# Patient Record
Sex: Female | Born: 1950 | Race: White | Hispanic: No | Marital: Married | Smoking: Never smoker
Health system: Southern US, Community
[De-identification: ages and names within clinical notes are randomized; demographics above are authoritative.]

## PROBLEM LIST (undated history)

## (undated) DIAGNOSIS — I10 Essential (primary) hypertension: Secondary | ICD-10-CM

## (undated) DIAGNOSIS — R112 Nausea with vomiting, unspecified: Secondary | ICD-10-CM

## (undated) DIAGNOSIS — Z9889 Other specified postprocedural states: Secondary | ICD-10-CM

## (undated) DIAGNOSIS — T8859XA Other complications of anesthesia, initial encounter: Secondary | ICD-10-CM

## (undated) DIAGNOSIS — E119 Type 2 diabetes mellitus without complications: Secondary | ICD-10-CM

## (undated) DIAGNOSIS — E079 Disorder of thyroid, unspecified: Secondary | ICD-10-CM

## (undated) DIAGNOSIS — N2889 Other specified disorders of kidney and ureter: Secondary | ICD-10-CM

## (undated) DIAGNOSIS — T4145XA Adverse effect of unspecified anesthetic, initial encounter: Secondary | ICD-10-CM

## (undated) DIAGNOSIS — I517 Cardiomegaly: Secondary | ICD-10-CM

## (undated) DIAGNOSIS — M810 Age-related osteoporosis without current pathological fracture: Secondary | ICD-10-CM

## (undated) HISTORY — PX: TOTAL ELBOW REPLACEMENT: SUR1214

## (undated) HISTORY — PX: CARDIAC DEFIBRILLATOR PLACEMENT: SHX171

## (undated) HISTORY — PX: BACK SURGERY: SHX140

---

## 2017-05-13 DIAGNOSIS — N2889 Other specified disorders of kidney and ureter: Secondary | ICD-10-CM

## 2017-05-13 HISTORY — DX: Other specified disorders of kidney and ureter: N28.89

## 2017-08-30 ENCOUNTER — Inpatient Hospital Stay (HOSPITAL_COMMUNITY)
Admission: EM | Admit: 2017-08-30 | Discharge: 2017-09-02 | DRG: 481 | Disposition: A | Discharge status: Skilled Nursing Facility | Payer: Medicare Other | Attending: Internal Medicine | Admitting: Internal Medicine

## 2017-08-30 ENCOUNTER — Emergency Department (HOSPITAL_COMMUNITY): Payer: Medicare Other

## 2017-08-30 ENCOUNTER — Other Ambulatory Visit: Payer: Self-pay

## 2017-08-30 ENCOUNTER — Encounter (HOSPITAL_COMMUNITY): Payer: Self-pay | Admitting: *Deleted

## 2017-08-30 DIAGNOSIS — Z6836 Body mass index (BMI) 36.0-36.9, adult: Secondary | ICD-10-CM | POA: Diagnosis not present

## 2017-08-30 DIAGNOSIS — S72141A Displaced intertrochanteric fracture of right femur, initial encounter for closed fracture: Principal | ICD-10-CM | POA: Diagnosis present

## 2017-08-30 DIAGNOSIS — I5022 Chronic systolic (congestive) heart failure: Secondary | ICD-10-CM | POA: Diagnosis present

## 2017-08-30 DIAGNOSIS — D62 Acute posthemorrhagic anemia: Secondary | ICD-10-CM | POA: Diagnosis not present

## 2017-08-30 DIAGNOSIS — I509 Heart failure, unspecified: Secondary | ICD-10-CM | POA: Diagnosis not present

## 2017-08-30 DIAGNOSIS — M810 Age-related osteoporosis without current pathological fracture: Secondary | ICD-10-CM | POA: Diagnosis present

## 2017-08-30 DIAGNOSIS — N183 Chronic kidney disease, stage 3 unspecified: Secondary | ICD-10-CM | POA: Diagnosis present

## 2017-08-30 DIAGNOSIS — E039 Hypothyroidism, unspecified: Secondary | ICD-10-CM | POA: Diagnosis present

## 2017-08-30 DIAGNOSIS — M25561 Pain in right knee: Secondary | ICD-10-CM | POA: Diagnosis present

## 2017-08-30 DIAGNOSIS — Z7982 Long term (current) use of aspirin: Secondary | ICD-10-CM

## 2017-08-30 DIAGNOSIS — Z79890 Hormone replacement therapy (postmenopausal): Secondary | ICD-10-CM

## 2017-08-30 DIAGNOSIS — Y92000 Kitchen of unspecified non-institutional (private) residence as  the place of occurrence of the external cause: Secondary | ICD-10-CM | POA: Diagnosis not present

## 2017-08-30 DIAGNOSIS — I130 Hypertensive heart and chronic kidney disease with heart failure and stage 1 through stage 4 chronic kidney disease, or unspecified chronic kidney disease: Secondary | ICD-10-CM | POA: Diagnosis present

## 2017-08-30 DIAGNOSIS — Z96629 Presence of unspecified artificial elbow joint: Secondary | ICD-10-CM | POA: Diagnosis present

## 2017-08-30 DIAGNOSIS — Z66 Do not resuscitate: Secondary | ICD-10-CM | POA: Diagnosis present

## 2017-08-30 DIAGNOSIS — E119 Type 2 diabetes mellitus without complications: Secondary | ICD-10-CM

## 2017-08-30 DIAGNOSIS — Z95810 Presence of automatic (implantable) cardiac defibrillator: Secondary | ICD-10-CM | POA: Diagnosis not present

## 2017-08-30 DIAGNOSIS — I10 Essential (primary) hypertension: Secondary | ICD-10-CM | POA: Diagnosis present

## 2017-08-30 DIAGNOSIS — S72001A Fracture of unspecified part of neck of right femur, initial encounter for closed fracture: Secondary | ICD-10-CM | POA: Diagnosis present

## 2017-08-30 DIAGNOSIS — Z79891 Long term (current) use of opiate analgesic: Secondary | ICD-10-CM | POA: Diagnosis not present

## 2017-08-30 DIAGNOSIS — M25551 Pain in right hip: Secondary | ICD-10-CM | POA: Diagnosis not present

## 2017-08-30 DIAGNOSIS — Z79899 Other long term (current) drug therapy: Secondary | ICD-10-CM

## 2017-08-30 DIAGNOSIS — E1122 Type 2 diabetes mellitus with diabetic chronic kidney disease: Secondary | ICD-10-CM | POA: Diagnosis present

## 2017-08-30 DIAGNOSIS — Z885 Allergy status to narcotic agent status: Secondary | ICD-10-CM

## 2017-08-30 DIAGNOSIS — I420 Dilated cardiomyopathy: Secondary | ICD-10-CM | POA: Diagnosis present

## 2017-08-30 DIAGNOSIS — Z8349 Family history of other endocrine, nutritional and metabolic diseases: Secondary | ICD-10-CM | POA: Diagnosis not present

## 2017-08-30 DIAGNOSIS — W010XXA Fall on same level from slipping, tripping and stumbling without subsequent striking against object, initial encounter: Secondary | ICD-10-CM | POA: Diagnosis present

## 2017-08-30 DIAGNOSIS — E669 Obesity, unspecified: Secondary | ICD-10-CM | POA: Diagnosis present

## 2017-08-30 DIAGNOSIS — Z419 Encounter for procedure for purposes other than remedying health state, unspecified: Secondary | ICD-10-CM

## 2017-08-30 DIAGNOSIS — Z794 Long term (current) use of insulin: Secondary | ICD-10-CM | POA: Diagnosis not present

## 2017-08-30 HISTORY — DX: Adverse effect of unspecified anesthetic, initial encounter: T41.45XA

## 2017-08-30 HISTORY — DX: Disorder of thyroid, unspecified: E07.9

## 2017-08-30 HISTORY — DX: Other specified postprocedural states: Z98.890

## 2017-08-30 HISTORY — DX: Essential (primary) hypertension: I10

## 2017-08-30 HISTORY — DX: Cardiomegaly: I51.7

## 2017-08-30 HISTORY — DX: Other specified postprocedural states: R11.2

## 2017-08-30 HISTORY — DX: Other complications of anesthesia, initial encounter: T88.59XA

## 2017-08-30 HISTORY — DX: Age-related osteoporosis without current pathological fracture: M81.0

## 2017-08-30 HISTORY — DX: Type 2 diabetes mellitus without complications: E11.9

## 2017-08-30 LAB — COMPREHENSIVE METABOLIC PANEL
ALT: 19 U/L (ref 14–54)
ANION GAP: 13 (ref 5–15)
AST: 25 U/L (ref 15–41)
Albumin: 4 g/dL (ref 3.5–5.0)
Alkaline Phosphatase: 70 U/L (ref 38–126)
BILIRUBIN TOTAL: 1 mg/dL (ref 0.3–1.2)
BUN: 29 mg/dL — ABNORMAL HIGH (ref 6–20)
CO2: 25 mmol/L (ref 22–32)
CREATININE: 1.52 mg/dL — AB (ref 0.44–1.00)
Calcium: 9.1 mg/dL (ref 8.9–10.3)
Chloride: 101 mmol/L (ref 101–111)
GFR calc non Af Amer: 34 mL/min — ABNORMAL LOW (ref >60)
GFR, EST AFRICAN AMERICAN: 40 mL/min — AB (ref >60)
GLUCOSE: 85 mg/dL (ref 65–99)
Potassium: 4.3 mmol/L (ref 3.5–5.1)
Sodium: 139 mmol/L (ref 135–145)
TOTAL PROTEIN: 7.5 g/dL (ref 6.5–8.1)

## 2017-08-30 LAB — CBC WITH DIFFERENTIAL/PLATELET
BASOS ABS: 0 10*3/uL (ref 0.0–0.1)
BASOS PCT: 0 %
EOS ABS: 0.2 10*3/uL (ref 0.0–0.7)
Eosinophils Relative: 3 %
HEMATOCRIT: 37.3 % (ref 36.0–46.0)
Hemoglobin: 11.8 g/dL — ABNORMAL LOW (ref 12.0–15.0)
Lymphocytes Relative: 23 %
Lymphs Abs: 1.6 10*3/uL (ref 0.7–4.0)
MCH: 29.9 pg (ref 26.0–34.0)
MCHC: 31.6 g/dL (ref 30.0–36.0)
MCV: 94.7 fL (ref 78.0–100.0)
MONO ABS: 0.8 10*3/uL (ref 0.1–1.0)
Monocytes Relative: 11 %
NEUTROS ABS: 4.4 10*3/uL (ref 1.7–7.7)
NEUTROS PCT: 63 %
Platelets: 158 10*3/uL (ref 150–400)
RBC: 3.94 MIL/uL (ref 3.87–5.11)
RDW: 13.8 % (ref 11.5–15.5)
WBC: 6.9 10*3/uL (ref 4.0–10.5)

## 2017-08-30 LAB — URINALYSIS, ROUTINE W REFLEX MICROSCOPIC
Bilirubin Urine: NEGATIVE
Glucose, UA: NEGATIVE mg/dL
Ketones, ur: NEGATIVE mg/dL
Nitrite: NEGATIVE
PROTEIN: NEGATIVE mg/dL
SPECIFIC GRAVITY, URINE: 1.013 (ref 1.005–1.030)
pH: 5 (ref 5.0–8.0)

## 2017-08-30 LAB — APTT: APTT: 30 s (ref 24–36)

## 2017-08-30 LAB — PROTIME-INR
INR: 1.02
PROTHROMBIN TIME: 13.3 s (ref 11.4–15.2)

## 2017-08-30 LAB — CBG MONITORING, ED: Glucose-Capillary: 92 mg/dL (ref 65–99)

## 2017-08-30 MED ORDER — ATORVASTATIN CALCIUM 80 MG PO TABS
80.0000 mg | ORAL_TABLET | Freq: Every day | ORAL | Status: DC
  Administered 2017-08-31 – 2017-09-02 (×3): 80 mg via ORAL
  Filled 2017-08-30 (×3): qty 1

## 2017-08-30 MED ORDER — BISACODYL 5 MG PO TBEC: 5.0000 mg | DELAYED_RELEASE_TABLET | Freq: Every day | ORAL | Status: DC | PRN

## 2017-08-30 MED ORDER — SODIUM CHLORIDE 0.9 % IV BOLUS
250.0000 mL | Freq: Once | INTRAVENOUS | Status: AC
  Administered 2017-08-30: 250 mL via INTRAVENOUS

## 2017-08-30 MED ORDER — HYDROMORPHONE HCL 1 MG/ML IJ SOLN
0.5000 mg | Freq: Once | INTRAMUSCULAR | Status: AC
  Administered 2017-08-30: 0.5 mg via INTRAVENOUS
  Filled 2017-08-30: qty 1

## 2017-08-30 MED ORDER — METHOCARBAMOL 500 MG PO TABS
500.0000 mg | ORAL_TABLET | Freq: Four times a day (QID) | ORAL | Status: DC | PRN
  Filled 2017-08-30: qty 1

## 2017-08-30 MED ORDER — INSULIN ASPART 100 UNIT/ML ~~LOC~~ SOLN
0.0000 [IU] | SUBCUTANEOUS | Status: DC
  Administered 2017-08-31: 5 [IU] via SUBCUTANEOUS
  Administered 2017-08-31: 3 [IU] via SUBCUTANEOUS
  Administered 2017-08-31: 7 [IU] via SUBCUTANEOUS
  Administered 2017-09-01: 5 [IU] via SUBCUTANEOUS
  Administered 2017-09-01: 7 [IU] via SUBCUTANEOUS
  Administered 2017-09-01 (×2): 5 [IU] via SUBCUTANEOUS
  Administered 2017-09-02: 2 [IU] via SUBCUTANEOUS
  Administered 2017-09-02: 3 [IU] via SUBCUTANEOUS
  Administered 2017-09-02: 2 [IU] via SUBCUTANEOUS

## 2017-08-30 MED ORDER — ONDANSETRON HCL 4 MG/2ML IJ SOLN: 4.0000 mg | Freq: Four times a day (QID) | INTRAMUSCULAR | Status: DC | PRN

## 2017-08-30 MED ORDER — ONDANSETRON HCL 4 MG/2ML IJ SOLN
4.0000 mg | Freq: Once | INTRAMUSCULAR | Status: AC
  Administered 2017-08-30: 4 mg via INTRAVENOUS
  Filled 2017-08-30: qty 2

## 2017-08-30 MED ORDER — GABAPENTIN 300 MG PO CAPS
300.0000 mg | ORAL_CAPSULE | Freq: Three times a day (TID) | ORAL | Status: DC
  Administered 2017-08-31 – 2017-09-02 (×8): 300 mg via ORAL
  Filled 2017-08-30 (×9): qty 1

## 2017-08-30 MED ORDER — HYDROMORPHONE HCL 1 MG/ML IJ SOLN
1.0000 mg | Freq: Once | INTRAMUSCULAR | Status: AC
  Administered 2017-08-30: 1 mg via INTRAVENOUS
  Filled 2017-08-30: qty 1

## 2017-08-30 MED ORDER — PROMETHAZINE HCL 25 MG/ML IJ SOLN
12.5000 mg | Freq: Once | INTRAMUSCULAR | Status: AC
  Administered 2017-08-30: 12.5 mg via INTRAVENOUS
  Filled 2017-08-30: qty 1

## 2017-08-30 MED ORDER — CARVEDILOL 25 MG PO TABS
25.0000 mg | ORAL_TABLET | Freq: Two times a day (BID) | ORAL | Status: DC
  Administered 2017-08-31 – 2017-09-02 (×6): 25 mg via ORAL
  Filled 2017-08-30 (×6): qty 1

## 2017-08-30 MED ORDER — METHOCARBAMOL 1000 MG/10ML IJ SOLN: 500.0000 mg | Freq: Four times a day (QID) | INTRAVENOUS | Status: DC | PRN

## 2017-08-30 MED ORDER — MORPHINE SULFATE (PF) 2 MG/ML IV SOLN
0.5000 mg | INTRAVENOUS | Status: DC | PRN
  Administered 2017-08-30: 0.5 mg via INTRAVENOUS
  Administered 2017-08-31: 1 mg via INTRAVENOUS
  Administered 2017-08-31: 0.5 mg via INTRAVENOUS
  Filled 2017-08-30 (×5): qty 1

## 2017-08-30 MED ORDER — MORPHINE SULFATE (PF) 2 MG/ML IV SOLN: 1.0000 mg | INTRAVENOUS | Status: DC | PRN

## 2017-08-30 MED ORDER — PANTOPRAZOLE SODIUM 40 MG PO TBEC
40.0000 mg | DELAYED_RELEASE_TABLET | Freq: Every day | ORAL | Status: DC
  Administered 2017-08-31: 40 mg via ORAL
  Filled 2017-08-30: qty 1

## 2017-08-30 MED ORDER — LEVOTHYROXINE SODIUM 88 MCG PO TABS
88.0000 ug | ORAL_TABLET | Freq: Every day | ORAL | Status: DC
  Administered 2017-08-31 – 2017-09-02 (×3): 88 ug via ORAL
  Filled 2017-08-30 (×4): qty 1

## 2017-08-30 MED ORDER — SENNOSIDES-DOCUSATE SODIUM 8.6-50 MG PO TABS: 1.0000 | ORAL_TABLET | Freq: Every evening | ORAL | Status: DC | PRN

## 2017-08-30 NOTE — ED Triage Notes (Signed)
Pt was visiting her daughter when she states that her feet "became tripped up" causing her to fall landing on her right hip. Pt denies hitting her head, c/o pain to right hip area.

## 2017-08-30 NOTE — ED Provider Notes (Signed)
[facility] EMERGENCY DEPARTMENT Provider Note   CSN: [id number] Arrival date & time: 08/30/17  4098     History   Chief Complaint Chief Complaint  Patient presents with  . Fall    HPI Tracy Sullivan is a 67 y.o. female.  Patient is a 67 year old female who presents to the emergency department with a complaint of a fall and right hip pain.  The patient was brought to the emergency department by EMS.  The patient states that she is visiting her daughter.  They were preparing to go out to dinner, she tripped in the kitchen, she fell, and landed primarily on her right hip.  The patient states that she has a history of osteoporosis.  The patient was unable to stand.  She has pain with any movement of the right lower extremity.  She denies hitting her head, or injuring her neck.  She denies any injury to her chest or abdomen.  Using any anticoagulation medications.  She presents now for assistance with this issue.  Patient denies    The history is provided by the patient.  Fall  Pertinent negatives include no chest pain, no abdominal pain and no shortness of breath.    History reviewed. No pertinent past medical history.  There are no active problems to display for this patient.   History reviewed. No pertinent surgical history.   OB History   None      Home Medications    Prior to Admission medications   Not on File    Family History No family history on file.  Social History Social History   Tobacco Use  . Smoking status: Not on file  Substance Use Topics  . Alcohol use: Not on file  . Drug use: Not on file     Allergies   Patient has no known allergies.   Review of Systems Review of Systems  Constitutional: Negative for activity change.       All ROS Neg except as noted in HPI  HENT: Negative for nosebleeds.   Eyes: Negative for photophobia and discharge.  Respiratory: Negative for cough, shortness of breath and wheezing.   Cardiovascular: Negative  for chest pain and palpitations.  Gastrointestinal: Negative for abdominal pain and blood in stool.  Genitourinary: Negative for dysuria, frequency and hematuria.  Musculoskeletal: Positive for arthralgias. Negative for back pain and neck pain.       Hip pain  Skin: Negative.   Neurological: Negative for dizziness, seizures and speech difficulty.  Psychiatric/Behavioral: Negative for confusion and hallucinations.     Physical Exam Updated Vital Signs There were no vitals taken for this visit.  Physical Exam  Constitutional: She is oriented to person, place, and time. She appears well-developed and well-nourished.  Non-toxic appearance.  HENT:  Head: Normocephalic.  Right Ear: Tympanic membrane and external ear normal.  Left Ear: Tympanic membrane and external ear normal.  No scalp abrasion or hematoma.  Eyes: Pupils are equal, round, and reactive to light. EOM and lids are normal.  Neck: Normal range of motion. Neck supple. Carotid bruit is not present.  Cardiovascular: Normal rate, regular rhythm, normal heart sounds, intact distal pulses and normal pulses.  Pulmonary/Chest: Breath sounds normal. No respiratory distress.  There is symmetrical rise and fall of the chest.  The patient speaks in complete sentences without problem.  There is no chest wall tenderness appreciated.  Abdominal: Soft. Bowel sounds are normal. There is no tenderness. There is no guarding.  Musculoskeletal: She exhibits  tenderness.  There is pain to palpation of the right hip.  There is pain to rocking of the pelvis.  There is pain at the quadricep area just above the right knee.  There is no palpable effusion appreciated at this time.  The patient has pain with any attempted movement of the right lower extremity.  There is no deformity or pain noted of the tibial area.  There is full range of motion of the toes and ankle on the right.  There is full range of motion of the left lower extremity.  There is total  range of motion of both right and left upper extremities.  Lymphadenopathy:       Head (right side): No submandibular adenopathy present.       Head (left side): No submandibular adenopathy present.    She has no cervical adenopathy.  Neurological: She is alert and oriented to person, place, and time. She has normal strength. No cranial nerve deficit or sensory deficit.  Skin: Skin is warm and dry.  Psychiatric: She has a normal mood and affect. Her speech is normal.  Nursing note and vitals reviewed.    ED Treatments / Results  Labs (all labs ordered are listed, but only abnormal results are displayed) Labs Reviewed - No data to display  EKG None  Radiology No results found.  Procedures Procedures (including critical care time)  Medications Ordered in ED Medications - No data to display   Initial Impression / Assessment and Plan / ED Course  I have reviewed the triage vital signs and the nursing notes.  Pertinent labs & imaging results that were available during my care of the patient were reviewed by me and considered in my medical decision making (see chart for details).      Final Clinical Impressions(s) / ED Diagnoses  MDM Patient sustained a fall and injured the right hip.  She also has pain of the right knee.   X-ray of the right knee is negative for fracture or dislocation.  X-ray of the right hip shows a comminuted intratrochanteric fracture of the right hip.    Case discussed with Dr. Tawanna SatBlackman-orthopedics.  He will see the patient at the [facility] in [place].  We will discussed the case with the hospitalist for hospital admission.  Urine analysis shows a trace leukocyte esterase with 6-30 WBCs.  We will send the urine down for culture.  The comprehensive metabolic panel shows the BUN to be elevated at 29, the creatinine elevated at 1.5 the glomerular filtration rate is low at 34.   Complete blood count is well within normal limits.  Chest x-ray is  negative for acute event.  The electrocardiogram shows an atrial sensed and ventricular paced rhythm with ventricular pacing.  There is a rate of 68 bpm.  Case discussed with Hospitalist. Pt to [facility]. Discussed plans with family. Questions answered.   Final diagnoses:  Closed right hip fracture, initial encounter [facility](HCC)    ED Discharge Orders    None       Ivery QualeBryant, Shadman Tozzi, PA-C 08/30/17 2300    Samuel JesterMcManus, Kathleen, DO 08/31/17 1746

## 2017-08-30 NOTE — H&P (Signed)
History and Physical    Tracy Sullivan ZOX:[id number] DOB: 09-08-1950 DOA: 08/30/2017  PCP: Irene Shipper, MD   Patient coming from: Home  Chief Complaint: Fall with right hip pain   HPI: Tracy Sullivan is a 67 y.o. female with medical history significant for CHF, insulin-dependent diabetes mellitus, hypertension, osteoarthritis, and hypothyroidism, now presenting to the emergency department for evaluation of severe right hip pain after a fall at home.  Patient had been in her usual state, was having an uneventful day, and tripped, falling onto her right side without hitting her head or losing consciousness.  She had immediate and severe pain at the right hip, prompting her to come into the ED for evaluation.  She reports that at her baseline, she does not experience any chest pain, but does become short of breath with walking several steps.  No recent fevers, chills, cough, vomiting, or diarrhea.  ED Course: Upon arrival to the ED, patient is found to be afebrile, saturating well on room air, and with vitals otherwise stable.  EKG features a paced rhythm and chest x-ray is negative for acute cardiopulmonary disease.  Radiographs of the right hip reveal intertrochanteric femoral fracture and radiographs of the knee are negative.  Chemistry panel is notable for creatinine 1.52, similar to her prior value.  CBC is unremarkable, urinalysis unremarkable, and INR is normal.  Orthopedic surgery was consulted by the ED physician and recommended transfer to [facility] where they will see the patient in consultation.  Review of Systems:  All other systems reviewed and apart from HPI, are negative.  Past Medical History:  Diagnosis Date  . Diabetes mellitus without complication (HCC)   . Enlarged heart   . Hypertension   . Osteoporosis   . Thyroid disease     Past Surgical History:  Procedure Laterality Date  . BACK SURGERY    . CARDIAC DEFIBRILLATOR PLACEMENT     defibrillator and  pacemaker per pt  . CESAREAN SECTION    . TOTAL ELBOW REPLACEMENT       reports that she has never smoked. She has never used smokeless tobacco. She reports that she does not drink alcohol or use drugs.  Allergies  Allergen Reactions  . Codeine Nausea Only  . Tramadol Nausea Only    Family History  Problem Relation Age of Onset  . Obesity Daughter      Prior to Admission medications   Medication Sig Start Date End Date Taking? Authorizing Provider  acetaminophen (TYLENOL) 650 MG CR tablet Take 650 mg by mouth every 8 (eight) hours as needed for pain.   Yes [provider]  amLODipine (NORVASC) 5 MG tablet Take 5 mg by mouth daily. 08/06/16  Yes [provider]  aspirin 81 MG tablet Take 81 tablets by mouth daily.   Yes [provider]  atorvastatin (LIPITOR) 40 MG tablet Take 40 tablets by mouth daily. 08/06/16  Yes [provider]  carvedilol (COREG) 25 MG tablet  07/08/17  Yes [provider]  furosemide (LASIX) 40 MG tablet Take 40 mg by mouth daily. 07/21/16  Yes [provider]  gabapentin (NEURONTIN) 300 MG capsule Take 300 mg by mouth 3 (three) times daily.  07/22/16  Yes [provider]  HYDROcodone-acetaminophen (NORCO) 10-325 MG tablet Take by mouth. 10/08/12  Yes [provider]  insulin NPH-regular Human (HUMULIN 70/30) (70-30) 100 UNIT/ML injection Inject 20 Units into the skin daily.   Yes [provider]  levothyroxine (SYNTHROID, LEVOTHROID)  88 MCG tablet Take 88 mcg by mouth daily.  07/08/17  Yes [provider]  lisinopril (PRINIVIL,ZESTRIL) 20 MG tablet Take 20 mg by mouth 2 (two) times daily. 05/27/16  Yes [provider]  loratadine (CLARITIN) 10 MG tablet Take 10 tablets by mouth daily.   Yes [provider]  omeprazole (PRILOSEC) 20 MG capsule Take 20 mg by mouth daily. 08/06/16  Yes [provider]  tiZANidine (ZANAFLEX) 4 MG tablet Take 4 mg by mouth  as needed. 08/06/16  Yes [provider]  Semaglutide (OZEMPIC) 0.25 or 0.5 MG/DOSE SOPN Inject 0.5 Units into the skin once a week. Takes on tuesdays    [provider]    Physical Exam: Vitals:   08/30/17 2130 08/30/17 2200 08/30/17 2230 08/30/17 2236  BP: 121/63 97/60 (!) 75/60 (!) 76/43  Pulse: 67 69 74 76  Resp: 15 11 11 17   Temp:      TempSrc:      SpO2: 96% 98% 98% 98%  Weight:      Height:          Constitutional: NAD, calm, obese, appears older than stated age  Eyes: PERTLA, lids and conjunctivae normal ENMT: Mucous membranes are moist. Posterior pharynx clear of any exudate or lesions.   Neck: normal, supple, no masses, no thyromegaly Respiratory: clear to auscultation bilaterally, no wheezing, no crackles. Normal respiratory effort.   Cardiovascular: S1 & S2 heard, regular rate and rhythm. No extremity edema.   Abdomen: No distension, no tenderness,soft. Bowel sounds normal.  Musculoskeletal: no clubbing / cyanosis. Right hip exquisitely tender; NVI distally.   Skin: no significant rashes, lesions, ulcers. Warm, dry, well-perfused. Neurologic: CN 2-12 grossly intact. Sensation intact. Strength 5/5 in all 4 limbs.  Psychiatric: Alert and oriented x 3. Calm, cooperative.     Labs on Admission: I have personally reviewed following labs and imaging studies  CBC: Recent Labs  Lab 08/30/17 2117  WBC 6.9  NEUTROABS 4.4  HGB 11.8*  HCT 37.3  MCV 94.7  PLT 158   Basic Metabolic Panel: Recent Labs  Lab 08/30/17 2117  NA 139  K 4.3  CL 101  CO2 25  GLUCOSE 85  BUN 29*  CREATININE 1.52*  CALCIUM 9.1   GFR: Estimated Creatinine Clearance: 39.1 mL/min (A) (by C-G formula based on SCr of 1.52 mg/dL (H)). Liver Function Tests: Recent Labs  Lab 08/30/17 2117  AST 25  ALT 19  ALKPHOS 70  BILITOT 1.0  PROT 7.5  ALBUMIN 4.0   No results for input(s): LIPASE, AMYLASE in the last 168 hours. No results for input(s): AMMONIA in the last 168  hours. Coagulation Profile: Recent Labs  Lab 08/30/17 2117  INR 1.02   Cardiac Enzymes: No results for input(s): CKTOTAL, CKMB, CKMBINDEX, TROPONINI in the last 168 hours. BNP (last 3 results) No results for input(s): PROBNP in the last 8760 hours. HbA1C: No results for input(s): HGBA1C in the last 72 hours. CBG: No results for input(s): GLUCAP in the last 168 hours. Lipid Profile: No results for input(s): CHOL, HDL, LDLCALC, TRIG, CHOLHDL, LDLDIRECT in the last 72 hours. Thyroid Function Tests: No results for input(s): TSH, T4TOTAL, FREET4, T3FREE, THYROIDAB in the last 72 hours. Anemia Panel: No results for input(s): VITAMINB12, FOLATE, FERRITIN, TIBC, IRON, RETICCTPCT in the last 72 hours. Urine analysis:    Component Value Date/Time   COLORURINE YELLOW 08/30/2017 2140   APPEARANCEUR CLEAR 08/30/2017 2140   LABSPEC 1.013 08/30/2017 2140   PHURINE  5.0 08/30/2017 2140   GLUCOSEU NEGATIVE 08/30/2017 2140   HGBUR SMALL (A) 08/30/2017 2140   BILIRUBINUR NEGATIVE 08/30/2017 2140   KETONESUR NEGATIVE 08/30/2017 2140   PROTEINUR NEGATIVE 08/30/2017 2140   NITRITE NEGATIVE 08/30/2017 2140   LEUKOCYTESUR TRACE (A) 08/30/2017 2140   Sepsis Labs: @LABRCNTIP (procalcitonin:4,lacticidven:4) )No results found for this or any previous visit (from the past 240 hour(s)).   Radiological Exams on Admission: Dg Chest 1 View  Result Date: 08/30/2017 CLINICAL DATA:  Recent fall EXAM: CHEST  1 VIEW COMPARISON:  None. FINDINGS: Cardiac shadow is within normal limits. Defibrillator is noted in satisfactory position. The lungs are well aerated bilaterally. No acute bony abnormality is seen. IMPRESSION: No acute abnormality noted. Electronically Signed   By: Alcide CleverMark  Lukens M.D.   On: 08/30/2017 21:28   Dg Knee Complete 4 Views Right  Result Date: 08/30/2017 CLINICAL DATA:  Recent fall with proximal right femoral fracture EXAM: RIGHT KNEE - COMPLETE 3 VIEW COMPARISON:  None. FINDINGS: No acute  fracture or dislocation is noted. No joint effusion is seen. No soft tissue abnormality is noted. IMPRESSION: No acute abnormality noted. Electronically Signed   By: Alcide CleverMark  Lukens M.D.   On: 08/30/2017 20:40   Dg Hip Unilat W Or Wo Pelvis 2-3 Views Right  Result Date: 08/30/2017 CLINICAL DATA:  Recent trip and fall with right hip pain, initial encounter EXAM: DG HIP (WITH OR WITHOUT PELVIS) 3V RIGHT COMPARISON:  None. FINDINGS: Comminuted intratrochanteric fracture is noted with mild impaction and angulation. No soft tissue abnormality is seen. No other bony abnormality is noted. IMPRESSION: Right intratrochanteric femoral fracture Electronically Signed   By: Alcide CleverMark  Lukens M.D.   On: 08/30/2017 20:39    EKG: Independently reviewed. Paced rhythm.   Assessment/Plan   1. Right hip fracture  - Presents with severe right hip pain after a mechanical fall at home  - Patient has significant chronic illness, including CHF, denies angina but becomes dyspneic with walking across a room at baseline  - Based on the available data, Ms. Cespedes presents an estimated 2.2% risk for perioperative MI or cardiac arrest per Nolon NationsGupta, et al  - Orthopedic surgery is consulting and much appreciated  - Check echocardiogram, continue supportive care    2. Chronic CHF  - No echo report on file; unable to get cardiology records [facility], Dr. Clance BolleWeese) d/t holiday weekend, will order echo for the am to help guide perioperative management  - SLIV, hold Lasix, follow daily wt and I/O's  - Hold lisinopril, continue Coreg as tolerated    3. CKD stage III  - SCr is 1.52 on admission, similar to prior  - Renally-dose medications, avoid nephrotoxins    4. Insulin-dependent DM  - No A1c on file  - Managed at home with Humulin 70/30 20 units qD and Ozempic  - Check CBG's and use a SSI with Novolog while in hospital    5. Hypothyroidism  - Continue Synthroid   6. Hypertension  - BP low-normal on admission  - Hold  lisinopril and Norvasc, continue Coreg as tolerated     DVT prophylaxis: SCD's  Code Status: DNR  Family Communication: Family updated at bedside Consults called: Orthopedic surgery  Admission status: Inpatient    Briscoe Deutscherimothy S Opyd, MD [facility] Pager [phone]  If 7PM-7AM, please contact night-coverage [email or web address] Password [email or web address]  08/30/2017, 10:54 PM

## 2017-08-31 ENCOUNTER — Encounter (HOSPITAL_COMMUNITY): Payer: Self-pay | Admitting: Certified Registered"

## 2017-08-31 ENCOUNTER — Encounter (HOSPITAL_COMMUNITY)
Admission: EM | Disposition: A | Discharge status: Skilled Nursing Facility | Payer: Self-pay | Source: Home / Self Care | Attending: Internal Medicine

## 2017-08-31 ENCOUNTER — Inpatient Hospital Stay (HOSPITAL_COMMUNITY): Payer: Medicare Other

## 2017-08-31 ENCOUNTER — Inpatient Hospital Stay (HOSPITAL_COMMUNITY): Payer: Medicare Other | Admitting: Certified Registered Nurse Anesthetist

## 2017-08-31 DIAGNOSIS — S72001A Fracture of unspecified part of neck of right femur, initial encounter for closed fracture: Secondary | ICD-10-CM

## 2017-08-31 HISTORY — PX: INTRAMEDULLARY (IM) NAIL INTERTROCHANTERIC: SHX5875

## 2017-08-31 LAB — GLUCOSE, CAPILLARY
GLUCOSE-CAPILLARY: 109 mg/dL — AB (ref 65–99)
GLUCOSE-CAPILLARY: 227 mg/dL — AB (ref 65–99)
Glucose-Capillary: 111 mg/dL — ABNORMAL HIGH (ref 65–99)
Glucose-Capillary: 328 mg/dL — ABNORMAL HIGH (ref 65–99)
Glucose-Capillary: 286 mg/dL — ABNORMAL HIGH (ref 65–99)

## 2017-08-31 SURGERY — Surgical Case
Anesthesia: *Unknown

## 2017-08-31 SURGERY — FIXATION, FRACTURE, INTERTROCHANTERIC, WITH INTRAMEDULLARY ROD
Anesthesia: Spinal | Site: Hip | Laterality: Right

## 2017-08-31 MED ORDER — ONDANSETRON HCL 4 MG PO TABS
4.0000 mg | ORAL_TABLET | Freq: Four times a day (QID) | ORAL | Status: DC | PRN
Start: 2017-08-31 — End: 2017-09-02

## 2017-08-31 MED ORDER — POLYETHYLENE GLYCOL 3350 17 G PO PACK: 17.0000 g | PACK | Freq: Every day | ORAL | Status: DC | PRN

## 2017-08-31 MED ORDER — ALBUMIN HUMAN 5 % IV SOLN
12.5000 g | Freq: Once | INTRAVENOUS | Status: AC
  Administered 2017-08-31: 12.5 g via INTRAVENOUS

## 2017-08-31 MED ORDER — TRAMADOL HCL 50 MG PO TABS
50.0000 mg | ORAL_TABLET | Freq: Four times a day (QID) | ORAL | Status: DC
  Administered 2017-08-31 – 2017-09-02 (×8): 50 mg via ORAL
  Filled 2017-08-31 (×8): qty 1

## 2017-08-31 MED ORDER — PHENYLEPHRINE 40 MCG/ML (10ML) SYRINGE FOR IV PUSH (FOR BLOOD PRESSURE SUPPORT)
PREFILLED_SYRINGE | INTRAVENOUS | Status: DC | PRN
  Administered 2017-08-31: 280 ug via INTRAVENOUS
  Administered 2017-08-31: 120 ug via INTRAVENOUS

## 2017-08-31 MED ORDER — DEXAMETHASONE SODIUM PHOSPHATE 10 MG/ML IJ SOLN
INTRAMUSCULAR | Status: DC | PRN
  Administered 2017-08-31: 10 mg via INTRAVENOUS

## 2017-08-31 MED ORDER — 0.9 % SODIUM CHLORIDE (POUR BTL) OPTIME
TOPICAL | Status: DC | PRN
  Administered 2017-08-31: 1000 mL

## 2017-08-31 MED ORDER — METHOCARBAMOL 1000 MG/10ML IJ SOLN
500.0000 mg | Freq: Four times a day (QID) | INTRAMUSCULAR | Status: DC | PRN
  Filled 2017-08-31: qty 5

## 2017-08-31 MED ORDER — METHOCARBAMOL 500 MG PO TABS
500.0000 mg | ORAL_TABLET | Freq: Four times a day (QID) | ORAL | Status: DC | PRN
  Administered 2017-09-01 – 2017-09-02 (×4): 500 mg via ORAL
  Filled 2017-08-31 (×3): qty 1

## 2017-08-31 MED ORDER — ONDANSETRON HCL 4 MG/2ML IJ SOLN: 4.0000 mg | Freq: Four times a day (QID) | INTRAMUSCULAR | Status: DC | PRN

## 2017-08-31 MED ORDER — METOCLOPRAMIDE HCL 5 MG/ML IJ SOLN: 5.0000 mg | Freq: Three times a day (TID) | INTRAMUSCULAR | Status: DC | PRN

## 2017-08-31 MED ORDER — ASPIRIN EC 325 MG PO TBEC
325.0000 mg | DELAYED_RELEASE_TABLET | Freq: Every day | ORAL | Status: DC
  Administered 2017-09-01 – 2017-09-02 (×2): 325 mg via ORAL
  Filled 2017-08-31 (×2): qty 1

## 2017-08-31 MED ORDER — ONDANSETRON HCL 4 MG/2ML IJ SOLN
INTRAMUSCULAR | Status: AC
  Filled 2017-08-31: qty 2

## 2017-08-31 MED ORDER — SODIUM CHLORIDE 0.9 % IV SOLN
INTRAVENOUS | Status: DC
  Administered 2017-08-31: 14:00:00 via INTRAVENOUS

## 2017-08-31 MED ORDER — PHENYLEPHRINE HCL 10 MG/ML IJ SOLN
INTRAMUSCULAR | Status: DC | PRN
  Administered 2017-08-31: 25 ug/min via INTRAVENOUS

## 2017-08-31 MED ORDER — LACTATED RINGERS IV SOLN
INTRAVENOUS | Status: DC | PRN
  Administered 2017-08-31: 10:00:00 via INTRAVENOUS

## 2017-08-31 MED ORDER — PROPOFOL 10 MG/ML IV BOLUS
INTRAVENOUS | Status: DC | PRN
  Administered 2017-08-31: 30 mg via INTRAVENOUS

## 2017-08-31 MED ORDER — SODIUM CHLORIDE 0.9 % IV SOLN
0.0000 ug/min | INTRAVENOUS | Status: DC
  Administered 2017-08-31: 30 ug/min via INTRAVENOUS

## 2017-08-31 MED ORDER — DEXAMETHASONE SODIUM PHOSPHATE 10 MG/ML IJ SOLN
INTRAMUSCULAR | Status: AC
Start: 2017-08-31 — End: ?
  Filled 2017-08-31: qty 1

## 2017-08-31 MED ORDER — SODIUM CHLORIDE 0.9 % IV BOLUS
500.0000 mL | Freq: Once | INTRAVENOUS | Status: AC
  Administered 2017-08-31: 500 mL via INTRAVENOUS

## 2017-08-31 MED ORDER — PROPOFOL 10 MG/ML IV BOLUS
INTRAVENOUS | Status: AC
  Filled 2017-08-31: qty 40

## 2017-08-31 MED ORDER — CEFAZOLIN SODIUM 1 G IJ SOLR
INTRAMUSCULAR | Status: AC
  Filled 2017-08-31: qty 20

## 2017-08-31 MED ORDER — ALBUMIN HUMAN 5 % IV SOLN
INTRAVENOUS | Status: AC
  Administered 2017-08-31: 12.5 g via INTRAVENOUS
  Filled 2017-08-31: qty 250

## 2017-08-31 MED ORDER — ONDANSETRON HCL 4 MG/2ML IJ SOLN: 4.0000 mg | Freq: Once | INTRAMUSCULAR | Status: DC | PRN

## 2017-08-31 MED ORDER — FENTANYL CITRATE (PF) 100 MCG/2ML IJ SOLN
INTRAMUSCULAR | Status: DC | PRN
  Administered 2017-08-31: 50 ug via INTRAVENOUS

## 2017-08-31 MED ORDER — DEXAMETHASONE SODIUM PHOSPHATE 10 MG/ML IJ SOLN
INTRAMUSCULAR | Status: AC
  Filled 2017-08-31: qty 1

## 2017-08-31 MED ORDER — HYDROCODONE-ACETAMINOPHEN 5-325 MG PO TABS: 1.0000 | ORAL_TABLET | ORAL | Status: DC | PRN

## 2017-08-31 MED ORDER — MENTHOL 3 MG MT LOZG: 1.0000 | LOZENGE | OROMUCOSAL | Status: DC | PRN

## 2017-08-31 MED ORDER — PROPOFOL 1000 MG/100ML IV EMUL
INTRAVENOUS | Status: AC
  Filled 2017-08-31: qty 100

## 2017-08-31 MED ORDER — FENTANYL CITRATE (PF) 250 MCG/5ML IJ SOLN
INTRAMUSCULAR | Status: AC
  Filled 2017-08-31: qty 5

## 2017-08-31 MED ORDER — METOCLOPRAMIDE HCL 5 MG PO TABS: 5.0000 mg | ORAL_TABLET | Freq: Three times a day (TID) | ORAL | Status: DC | PRN

## 2017-08-31 MED ORDER — LIDOCAINE 2% (20 MG/ML) 5 ML SYRINGE
INTRAMUSCULAR | Status: AC
  Filled 2017-08-31: qty 5

## 2017-08-31 MED ORDER — LIDOCAINE 2% (20 MG/ML) 5 ML SYRINGE
INTRAMUSCULAR | Status: DC | PRN
  Administered 2017-08-31: 60 mg via INTRAVENOUS

## 2017-08-31 MED ORDER — PANTOPRAZOLE SODIUM 40 MG PO TBEC
40.0000 mg | DELAYED_RELEASE_TABLET | Freq: Every day | ORAL | Status: DC
  Administered 2017-08-31 – 2017-09-02 (×3): 40 mg via ORAL
  Filled 2017-08-31 (×3): qty 1

## 2017-08-31 MED ORDER — ONDANSETRON HCL 4 MG/2ML IJ SOLN
INTRAMUSCULAR | Status: DC | PRN
  Administered 2017-08-31: 4 mg via INTRAVENOUS

## 2017-08-31 MED ORDER — PROPOFOL 500 MG/50ML IV EMUL
INTRAVENOUS | Status: DC | PRN
  Administered 2017-08-31: 75 ug/kg/min via INTRAVENOUS

## 2017-08-31 MED ORDER — PHENOL 1.4 % MT LIQD: 1.0000 | OROMUCOSAL | Status: DC | PRN

## 2017-08-31 MED ORDER — ACETAMINOPHEN 325 MG PO TABS
325.0000 mg | ORAL_TABLET | Freq: Four times a day (QID) | ORAL | Status: DC | PRN
  Administered 2017-08-31: 650 mg via ORAL
  Filled 2017-08-31: qty 2

## 2017-08-31 MED ORDER — PHENYLEPHRINE 40 MCG/ML (10ML) SYRINGE FOR IV PUSH (FOR BLOOD PRESSURE SUPPORT)
PREFILLED_SYRINGE | INTRAVENOUS | Status: AC
  Filled 2017-08-31: qty 10

## 2017-08-31 MED ORDER — FENTANYL CITRATE (PF) 100 MCG/2ML IJ SOLN: 25.0000 ug | INTRAMUSCULAR | Status: DC | PRN

## 2017-08-31 MED ORDER — MORPHINE SULFATE (PF) 2 MG/ML IV SOLN
0.5000 mg | INTRAVENOUS | Status: DC | PRN
  Administered 2017-08-31: 1 mg via INTRAVENOUS
  Administered 2017-08-31: 0.5 mg via INTRAVENOUS
  Administered 2017-08-31: 1 mg via INTRAVENOUS
  Filled 2017-08-31: qty 1

## 2017-08-31 MED ORDER — CEFAZOLIN SODIUM-DEXTROSE 2-4 GM/100ML-% IV SOLN
2.0000 g | Freq: Four times a day (QID) | INTRAVENOUS | Status: AC
  Administered 2017-08-31 (×2): 2 g via INTRAVENOUS
  Filled 2017-08-31 (×2): qty 100

## 2017-08-31 MED ORDER — HYDROCODONE-ACETAMINOPHEN 7.5-325 MG PO TABS
1.0000 | ORAL_TABLET | ORAL | Status: DC | PRN
  Administered 2017-09-01 – 2017-09-02 (×4): 2 via ORAL
  Administered 2017-09-02: 1 via ORAL
  Filled 2017-08-31 (×5): qty 2

## 2017-08-31 MED ORDER — DOCUSATE SODIUM 100 MG PO CAPS
100.0000 mg | ORAL_CAPSULE | Freq: Two times a day (BID) | ORAL | Status: DC
  Administered 2017-08-31 – 2017-09-02 (×5): 100 mg via ORAL
  Filled 2017-08-31 (×5): qty 1

## 2017-08-31 MED ORDER — CEFAZOLIN SODIUM-DEXTROSE 2-3 GM-%(50ML) IV SOLR
INTRAVENOUS | Status: DC | PRN
  Administered 2017-08-31: 2 g via INTRAVENOUS

## 2017-08-31 SURGICAL SUPPLY — 45 items
BLADE SURG 15 STRL LF DISP TIS (BLADE) ×1 IMPLANT
BLADE SURG 15 STRL SS (BLADE) ×1
BNDG GAUZE ELAST 4 BULKY (GAUZE/BANDAGES/DRESSINGS) ×2 IMPLANT
COVER PERINEAL POST (MISCELLANEOUS) ×2 IMPLANT
COVER SURGICAL LIGHT HANDLE (MISCELLANEOUS) ×2 IMPLANT
DRAPE STERI IOBAN 125X83 (DRAPES) ×2 IMPLANT
DRSG MEPILEX BORDER 4X4 (GAUZE/BANDAGES/DRESSINGS) ×2 IMPLANT
DRSG MEPILEX BORDER 4X8 (GAUZE/BANDAGES/DRESSINGS) ×2 IMPLANT
DRSG PAD ABDOMINAL 8X10 ST (GAUZE/BANDAGES/DRESSINGS) ×4 IMPLANT
DURAPREP 26ML APPLICATOR (WOUND CARE) ×2 IMPLANT
ELECT REM PT RETURN 9FT ADLT (ELECTROSURGICAL) ×2
ELECTRODE REM PT RTRN 9FT ADLT (ELECTROSURGICAL) ×1 IMPLANT
FACESHIELD WRAPAROUND (MASK) ×2 IMPLANT
GAUZE XEROFORM 1X8 LF (GAUZE/BANDAGES/DRESSINGS) ×2 IMPLANT
GAUZE XEROFORM 5X9 LF (GAUZE/BANDAGES/DRESSINGS) ×2 IMPLANT
GLOVE BIO SURGEON STRL SZ8 (GLOVE) ×2 IMPLANT
GLOVE BIOGEL PI IND STRL 8 (GLOVE) ×1 IMPLANT
GLOVE BIOGEL PI INDICATOR 8 (GLOVE) ×1
GLOVE ORTHO TXT STRL SZ7.5 (GLOVE) ×2 IMPLANT
GOWN STRL REUS W/ TWL LRG LVL3 (GOWN DISPOSABLE) ×2 IMPLANT
GOWN STRL REUS W/ TWL XL LVL3 (GOWN DISPOSABLE) ×2 IMPLANT
GOWN STRL REUS W/TWL LRG LVL3 (GOWN DISPOSABLE) ×2
GOWN STRL REUS W/TWL XL LVL3 (GOWN DISPOSABLE) ×2
K-WIRE  3.2X450M STR (WIRE) ×1
K-WIRE 3.2X450M STR (WIRE) ×1
KIT BASIN OR (CUSTOM PROCEDURE TRAY) ×2 IMPLANT
KIT NAIL LONG 10X340X125 (Nail) ×2 IMPLANT
KIT TURNOVER KIT B (KITS) ×2 IMPLANT
KWIRE 3.2X450M STR (WIRE) ×1 IMPLANT
LINER BOOT UNIVERSAL DISP (MISCELLANEOUS) ×2 IMPLANT
MANIFOLD NEPTUNE II (INSTRUMENTS) ×2 IMPLANT
NS IRRIG 1000ML POUR BTL (IV SOLUTION) ×2 IMPLANT
PACK GENERAL/GYN (CUSTOM PROCEDURE TRAY) ×2 IMPLANT
PAD ARMBOARD 7.5X6 YLW CONV (MISCELLANEOUS) ×4 IMPLANT
PAD CAST 4YDX4 CTTN HI CHSV (CAST SUPPLIES) ×2 IMPLANT
PADDING CAST COTTON 4X4 STRL (CAST SUPPLIES) ×2
SCREW LAG GAMMA 3 TI 10.5X100M (Screw) ×2 IMPLANT
STAPLER VISISTAT 35W (STAPLE) ×2 IMPLANT
SUT VIC AB 0 CT1 27 (SUTURE) ×2
SUT VIC AB 0 CT1 27XBRD ANBCTR (SUTURE) ×2 IMPLANT
SUT VIC AB 2-0 CT1 27 (SUTURE) ×2
SUT VIC AB 2-0 CT1 TAPERPNT 27 (SUTURE) ×2 IMPLANT
TOWEL OR 17X24 6PK STRL BLUE (TOWEL DISPOSABLE) ×2 IMPLANT
TOWEL OR 17X26 10 PK STRL BLUE (TOWEL DISPOSABLE) ×2 IMPLANT
WATER STERILE IRR 1000ML POUR (IV SOLUTION) ×2 IMPLANT

## 2017-08-31 NOTE — Progress Notes (Signed)
Patient arrived from [facility] ED to [id number]. MD notified per order. Thanks.

## 2017-08-31 NOTE — Progress Notes (Signed)
Blood pressure update post bolus notified to [facility].

## 2017-08-31 NOTE — Consult Note (Signed)
Reason for Consult:  Right hip fracture Referring Physician: [facility] ED provider  Tracy Sullivan is an 67 y.o. female.  HPI: Patient is a 67 year old female with a history of diabetes and congestive heart failure who sustained an accidental mechanical fall when she was at her daughter's yesterday landing hard on her right hip.  She was seen at [facility] and x-rays were obtained showed a right intertrochanteric hip fracture.  Due to no orthopedic coverage at the hospital she was transferred to [facility] for definitive treatment.  She is admitted late last night to the hospitalist service.  She denies any shortness of breath or syncopal episode but she had does have a history of limited mobility due to her obesity.  She denies any shortness of breath today or any chest pain.  She does report significant right hip pain.  She has been seen by the hospitalist service.  They are watching her diabetes as well as her heart.  Per their note she is a 2.2% risk of any cardiac issues as it relates to clearance for surgery.  He did want to obtain a heart echo just for perioperative management as they watch her fluid status.  I have spoken her about recommendation for surgery based on the nature of her right hip intertrochanteric fracture.  Past Medical History:  Diagnosis Date  . Diabetes mellitus without complication ([place])   . Enlarged heart   . Hypertension   . Osteoporosis   . Thyroid disease     Past Surgical History:  Procedure Laterality Date  . BACK SURGERY    . CARDIAC DEFIBRILLATOR PLACEMENT     defibrillator and pacemaker per pt  . CESAREAN SECTION    . TOTAL ELBOW REPLACEMENT      Family History  Problem Relation Age of Onset  . Obesity Daughter     Social History:  reports that she has never smoked. She has never used smokeless tobacco. She reports that she does not drink alcohol or use drugs.  Allergies:  Allergies  Allergen Reactions   . Codeine Nausea Only  . Tramadol Nausea Only    Medications: I have reviewed the patient's current medications.  Results for orders placed or performed during the hospital encounter of 08/30/17 (from the past 48 hour(s))  Comprehensive metabolic panel     Status: Abnormal   Collection Time: 08/30/17  9:17 PM  Result Value Ref Range   Sodium 139 135 - 145 mmol/L   Potassium 4.3 3.5 - 5.1 mmol/L   Chloride 101 101 - 111 mmol/L   CO2 25 22 - 32 mmol/L   Glucose, Bld 85 65 - 99 mg/dL   BUN 29 (H) 6 - 20 mg/dL   Creatinine, Ser 1.52 (H) 0.44 - 1.00 mg/dL   Calcium 9.1 8.9 - 10.3 mg/dL   Total Protein 7.5 6.5 - 8.1 g/dL   Albumin 4.0 3.5 - 5.0 g/dL   AST 25 15 - 41 U/L   ALT 19 14 - 54 U/L   Alkaline Phosphatase 70 38 - 126 U/L   Total Bilirubin 1.0 0.3 - 1.2 mg/dL   GFR calc non Af Amer 34 (L) >60 mL/min   GFR calc Af Amer 40 (L) >60 mL/min    Comment: (NOTE) The eGFR has been calculated using the CKD EPI equation. This calculation has not been validated in all clinical situations. eGFR's persistently <60 mL/min signify possible Chronic Kidney Disease.    Anion gap  13 5 - 15    Comment: Performed at [facility], [address] [place], [place] [address]  CBC with Differential     Status: Abnormal   Collection Time: 08/30/17  9:17 PM  Result Value Ref Range   WBC 6.9 4.0 - 10.5 K/uL   RBC 3.94 3.87 - 5.11 MIL/uL   Hemoglobin 11.8 (L) 12.0 - 15.0 g/dL   HCT 37.3 36.0 - 46.0 %   MCV 94.7 78.0 - 100.0 fL   MCH 29.9 26.0 - 34.0 pg   MCHC 31.6 30.0 - 36.0 g/dL   RDW 13.8 11.5 - 15.5 %   Platelets 158 150 - 400 K/uL   Neutrophils Relative % 63 %   Neutro Abs 4.4 1.7 - 7.7 K/uL   Lymphocytes Relative 23 %   Lymphs Abs 1.6 0.7 - 4.0 K/uL   Monocytes Relative 11 %   Monocytes Absolute 0.8 0.1 - 1.0 K/uL   Eosinophils Relative 3 %   Eosinophils Absolute 0.2 0.0 - 0.7 K/uL   Basophils Relative 0 %   Basophils Absolute 0.0 0.0 - 0.1 K/uL    Comment: Performed at [facility], [address], [place], [place] [address]  Protime-INR     Status: None   Collection Time: 08/30/17  9:17 PM  Result Value Ref Range   Prothrombin Time 13.3 11.4 - 15.2 seconds   INR 1.02     Comment: Performed at [facility], [address] [place], [place] [address]  APTT     Status: None   Collection Time: 08/30/17  9:17 PM  Result Value Ref Range   aPTT 30 24 - 36 seconds    Comment: Performed at [facility], [address], [place], [place] [address]  Urinalysis, Routine w reflex microscopic     Status: Abnormal   Collection Time: 08/30/17  9:40 PM  Result Value Ref Range   Color, Urine YELLOW YELLOW   APPearance CLEAR CLEAR   Specific Gravity, Urine 1.013 1.005 - 1.030   pH 5.0 5.0 - 8.0   Glucose, UA NEGATIVE NEGATIVE mg/dL   Hgb urine dipstick SMALL (A) NEGATIVE   Bilirubin Urine NEGATIVE NEGATIVE   Ketones, ur NEGATIVE NEGATIVE mg/dL   Protein, ur NEGATIVE NEGATIVE mg/dL   Nitrite NEGATIVE NEGATIVE   Leukocytes, UA TRACE (A) NEGATIVE   RBC / HPF 0-5 0 - 5 RBC/hpf   WBC, UA 6-30 0 - 5 WBC/hpf   Bacteria, UA RARE (A) NONE SEEN   Squamous Epithelial / LPF 0-5 (A) NONE SEEN   Mucus PRESENT    Granular Casts, UA PRESENT     Comment: Performed at [facility], [address] [place], [place] [address]  CBG monitoring, ED     Status: None   Collection Time: 08/30/17 11:21 PM  Result Value Ref Range   Glucose-Capillary 92 65 - 99 mg/dL  Glucose, capillary     Status: Abnormal   Collection Time: 08/31/17  3:29 AM  Result Value Ref Range   Glucose-Capillary 109 (H) 65 - 99 mg/dL   Comment 1 Document in Chart     Dg Chest 1 View  Result Date: 08/30/2017 CLINICAL DATA:  Recent fall EXAM: CHEST  1 VIEW COMPARISON:  None. FINDINGS: Cardiac shadow is within normal limits. Defibrillator is noted in satisfactory position. The lungs are well aerated bilaterally. No acute bony abnormality is seen. IMPRESSION: No acute abnormality noted. Electronically Signed   By: Inez Catalina  M.D.   On:  08/30/2017 21:28   Dg Knee Complete 4 Views Right  Result Date: 08/30/2017 CLINICAL DATA:  Recent fall with proximal right femoral fracture EXAM: RIGHT KNEE - COMPLETE 3 VIEW COMPARISON:  None. FINDINGS: No acute fracture or dislocation is noted. No joint effusion is seen. No soft tissue abnormality is noted. IMPRESSION: No acute abnormality noted. Electronically Signed   By: Inez Catalina M.D.   On: 08/30/2017 20:40   Dg Hip Unilat W Or Wo Pelvis 2-3 Views Right  Result Date: 08/30/2017 CLINICAL DATA:  Recent trip and fall with right hip pain, initial encounter EXAM: DG HIP (WITH OR WITHOUT PELVIS) 3V RIGHT COMPARISON:  None. FINDINGS: Comminuted intratrochanteric fracture is noted with mild impaction and angulation. No soft tissue abnormality is seen. No other bony abnormality is noted. IMPRESSION: Right intratrochanteric femoral fracture Electronically Signed   By: Inez Catalina M.D.   On: 08/30/2017 20:39   Independent review of x-rays shows a right hip intertrochanteric proximal femur fracture.   Review of Systems  All other systems reviewed and are negative.  Blood pressure (!) 102/53, pulse 71, temperature 98.4 F (36.9 C), temperature source Oral, resp. rate 19, height 5' 2"  (1.575 m), weight 220 lb (99.8 kg), SpO2 100 %. Physical Exam  Constitutional: She is oriented to person, place, and time. She appears well-developed and well-nourished.  HENT:  Head: Normocephalic and atraumatic.  Neck: Normal range of motion.  Cardiovascular: Normal rate.  Respiratory: Effort normal.  GI: Soft.  Musculoskeletal:       Right hip: She exhibits decreased range of motion, decreased strength, tenderness and bony tenderness.  Neurological: She is alert and oriented to person, place, and time.  Psychiatric: She has a normal mood and affect.    Assessment/Plan: Right hip with displaced intertrochanteric femur/hip fracture  She has been n.p.o. since midnight.  I talked her in length  about her hip fracture and a recommendation for surgery.  This would be through 2 small incisions to place an intramedullary rod and hip screw.  Risks and benefits were discussed in detail.  The goals for surgery are decreasing her pain and the ability to mobilize her early to avoid other risks of pneumonia, bedsores and DVT.  Mcarthur Rossetti 08/31/2017, 7:22 AM

## 2017-08-31 NOTE — Anesthesia Postprocedure Evaluation (Signed)
Anesthesia Post Note  Patient: Tracy Sullivan  Procedure(s) Performed: INTRAMEDULLARY (IM) NAIL INTERTROCHANTRIC (Right Hip)     Patient location during evaluation: PACU Anesthesia Type: Spinal Level of consciousness: oriented and awake and alert Pain management: pain level controlled Vital Signs Assessment: post-procedure vital signs reviewed and stable Respiratory status: spontaneous breathing, respiratory function stable and patient connected to nasal cannula oxygen Cardiovascular status: blood pressure returned to baseline and stable Postop Assessment: no headache, no backache and no apparent nausea or vomiting Anesthetic complications: no    Last Vitals:  Vitals:   08/31/17 1350 08/31/17 1413  BP:  (!) 109/54  Pulse:  70  Resp:    Temp: 36.4 C 36.4 C  SpO2:  94%    Last Pain:  Vitals:   08/31/17 1455  TempSrc:   PainSc: 4                  Montrel Donahoe COKER

## 2017-08-31 NOTE — Progress Notes (Signed)
Blood pressure update notified to [facility].

## 2017-08-31 NOTE — Progress Notes (Signed)
PROGRESS NOTE                                                                                                                                                                                                             Patient Demographics:    Tracy Sullivan, is a 67 y.o. female, DOB - 03/31/1951, ZOX:[id number]  Admit date - 08/30/2017   Admitting Physician Briscoe Deutscher, MD  Outpatient Primary MD for the patient is Irene Shipper, MD  LOS - 1   Chief Complaint  Patient presents with  . Fall       Brief Narrative   Tracy Sullivan is a 67 y.o. female with medical history significant for CHF, insulin-dependent diabetes mellitus, hypertension, osteoarthritis, and hypothyroidism, ince with right hip post mechanical fall.    Subjective:    Tracy Sullivan today has, No headache, No chest pain, No abdominal pain -reports right hip pain.   Assessment  & Plan :    Principal Problem:   Closed right hip fracture, initial encounter (HCC) Active Problems:   Diabetes mellitus without complication (HCC)   Hypertension   CKD (chronic kidney disease), stage III (HCC)   Chronic CHF (HCC)   Hypothyroidism    Right hip fracture  - Presents with severe right hip pain after a mechanical fall at home  - Status post surgical repair by Dr. Roe Rutherford with pain medication when necessary, DC Foley  A.m., PT nsult in a.m.Marland Kitchen - dVT prophylaxis. Also, continue with aspirin  Chronic CHF  - No echo report on file; unable to get cardiology records [facility], Dr. Clance Boll) d/t holiday weekend,  - appears to be euvolemic, will hold Lasix, monitor daily weights and strict ins and outs - Hold lisinopril, continue Coreg as tolerated    CKD stage III  - SCr is 1.52 on admission, similar to prior  - Renally-dose medications, avoid nephrotoxins    Insulin-dependent DM  - Managed at home with Humulin 70/30 20 units qD and Ozempic  - Check CBG's and use a SSI  with Novolog while in hospital    Hypothyroidism  - Continue Synthroid   Hypertension  - blood pressure remains on the lower side, continue hold lisinopril and Norvasc, continue with Coregas tolerated      Code Status : DO NOT RESUSCITATE  Family Communication  :  multiple family memb bedside  Disposition Plan  : pending PT consult, likely will need SNF placement  Consults  :  orthopedic  Procedures  : INTRAMEDULLARY (IM) NAIL INTERTROCHANTRIC (Right)by Dr. Magnus IvanBlackman for 2119    DVT Prophylaxis  :  Aspirin- SCDs   Lab Results  Component Value Date   PLT 158 08/30/2017    Antibiotics  :    Anti-infectives (From admission, onward)   Start     Dose/Rate Route Frequency Ordered Stop   08/31/17 1700  ceFAZolin (ANCEF) IVPB 2g/100 mL premix     2 g 200 mL/hr over 30 Minutes Intravenous Every 6 hours 08/31/17 1221 09/01/17 0459        Objective:   Vitals:   08/31/17 1339 08/31/17 1349 08/31/17 1350 08/31/17 1413  BP: (!) 95/54 (!) 104/56  (!) 109/54  Pulse: 64 63  70  Resp: 11 14    Temp:   97.6 F (36.4 C) 97.6 F (36.4 C)  TempSrc:    Oral  SpO2: 94% 99%  94%  Weight:      Height:        Wt Readings from Last 3 Encounters:  08/30/17 99.8 kg (220 lb)     Intake/Output Summary (Last 24 hours) at 08/31/2017 1447 Last data filed at 08/31/2017 1130 Gross per 24 hour  Intake 1296.88 ml  Output 200 ml  Net 1096.88 ml     Physical Exam  Awake Alert, Oriented X 3, No new F.N deficits, Normal affect Supple Neck,No JVD, No cervical lymphadenopathy appriciated.  Symmetrical Chest wall movement, Good air movement bilaterally, CTAB RRR,No Gallops,Rubs or new Murmurs, No Parasternal Heave +ve B.Sounds, Abd Soft, No tenderness,  guarding or rigidity. No Cyanosis, Clubbing or edema, No new Rash or bruise      Data Review:    CBC Recent Labs  Lab 08/30/17 2117  WBC 6.9  HGB 11.8*  HCT 37.3  PLT 158  MCV 94.7  MCH 29.9  MCHC 31.6  RDW 13.8    LYMPHSABS 1.6  MONOABS 0.8  EOSABS 0.2  BASOSABS 0.0    Chemistries  Recent Labs  Lab 08/30/17 2117  NA 139  K 4.3  CL 101  CO2 25  GLUCOSE 85  BUN 29*  CREATININE 1.52*  CALCIUM 9.1  AST 25  ALT 19  ALKPHOS 70  BILITOT 1.0   ------------------------------------------------------------------------------------------------------------------ No results for input(s): CHOL, HDL, LDLCALC, TRIG, CHOLHDL, LDLDIRECT in the last 72 hours.  No results found for: HGBA1C ------------------------------------------------------------------------------------------------------------------ No results for input(s): TSH, T4TOTAL, T3FREE, THYROIDAB in the last 72 hours.  Invalid input(s): FREET3 ------------------------------------------------------------------------------------------------------------------ No results for input(s): VITAMINB12, FOLATE, FERRITIN, TIBC, IRON, RETICCTPCT in the last 72 hours.  Coagulation profile Recent Labs  Lab 08/30/17 2117  INR 1.02    No results for input(s): DDIMER in the last 72 hours.  Cardiac Enzymes No results for input(s): CKMB, TROPONINI, MYOGLOBIN in the last 168 hours.  Invalid input(s): CK ------------------------------------------------------------------------------------------------------------------ No results found for: BNP  Inpatient Medications  Scheduled Meds: . [START ON 09/01/2017] aspirin EC  325 mg Oral Q breakfast  . atorvastatin  80 mg Oral q1800  . carvedilol  25 mg Oral BID WC  . docusate sodium  100 mg Oral BID  . gabapentin  300 mg Oral TID  . insulin aspart  0-9 Units Subcutaneous Q4H  . levothyroxine  88 mcg Oral QAC breakfast  . pantoprazole  40 mg Oral Daily  . traMADol  50 mg Oral Q6H  Continuous Infusions: . sodium chloride 50 mL/hr at 08/31/17 1420  .  ceFAZolin (ANCEF) IV    . methocarbamol (ROBAXIN)  IV    . methocarbamol (ROBAXIN)  IV     PRN Meds:.[START ON 09/01/2017] acetaminophen, bisacodyl,  HYDROcodone-acetaminophen, HYDROcodone-acetaminophen, menthol-cetylpyridinium **OR** phenol, methocarbamol **OR** methocarbamol (ROBAXIN)  IV, methocarbamol **OR** methocarbamol (ROBAXIN)  IV, metoCLOPramide **OR** metoCLOPramide (REGLAN) injection, morphine injection, morphine injection, ondansetron, ondansetron **OR** ondansetron (ZOFRAN) IV, polyethylene glycol, senna-docusate  Micro Results No results found for this or any previous visit (from the past 240 hour(s)).  Radiology Reports Dg Chest 1 View  Result Date: 08/30/2017 CLINICAL DATA:  Recent fall EXAM: CHEST  1 VIEW COMPARISON:  None. FINDINGS: Cardiac shadow is within normal limits. Defibrillator is noted in satisfactory position. The lungs are well aerated bilaterally. No acute bony abnormality is seen. IMPRESSION: No acute abnormality noted. Electronically Signed   By: Alcide Clever M.D.   On: 08/30/2017 21:28   Dg Knee Complete 4 Views Right  Result Date: 08/30/2017 CLINICAL DATA:  Recent fall with proximal right femoral fracture EXAM: RIGHT KNEE - COMPLETE 3 VIEW COMPARISON:  None. FINDINGS: No acute fracture or dislocation is noted. No joint effusion is seen. No soft tissue abnormality is noted. IMPRESSION: No acute abnormality noted. Electronically Signed   By: Alcide Clever M.D.   On: 08/30/2017 20:40   Dg C-arm 1-60 Min  Result Date: 08/31/2017 CLINICAL DATA:  Intraoperative imaging for fixation of a right intertrochanteric fracture the patient suffered in a fall yesterday. Initial encounter. EXAM: DG C-ARM 61-120 MIN; RIGHT FEMUR 2 VIEWS COMPARISON:  Plain films right hip this same day. FINDINGS: Five fluoroscopic intraoperative spot views demonstrate a hip screw and intramedullary nail in place for fixation of a right intertrochanteric fracture. Position alignment are anatomic. No acute finding. IMPRESSION: ORIF right hip fracture.  No acute finding. Electronically Signed   By: Drusilla Kanner M.D.   On: 08/31/2017 13:36   Dg  Hip Unilat W Or Wo Pelvis 2-3 Views Right  Result Date: 08/30/2017 CLINICAL DATA:  Recent trip and fall with right hip pain, initial encounter EXAM: DG HIP (WITH OR WITHOUT PELVIS) 3V RIGHT COMPARISON:  None. FINDINGS: Comminuted intratrochanteric fracture is noted with mild impaction and angulation. No soft tissue abnormality is seen. No other bony abnormality is noted. IMPRESSION: Right intratrochanteric femoral fracture Electronically Signed   By: Alcide Clever M.D.   On: 08/30/2017 20:39   Dg Femur, Min 2 Views Right  Result Date: 08/31/2017 CLINICAL DATA:  Intraoperative imaging for fixation of a right intertrochanteric fracture the patient suffered in a fall yesterday. Initial encounter. EXAM: DG C-ARM 61-120 MIN; RIGHT FEMUR 2 VIEWS COMPARISON:  Plain films right hip this same day. FINDINGS: Five fluoroscopic intraoperative spot views demonstrate a hip screw and intramedullary nail in place for fixation of a right intertrochanteric fracture. Position alignment are anatomic. No acute finding. IMPRESSION: ORIF right hip fracture.  No acute finding. Electronically Signed   By: Drusilla Kanner M.D.   On: 08/31/2017 13:36    Time Spent in minutes  25 min   Huey Bienenstock M.D on 08/31/2017 at 2:47 PM  Between 7am to 7pm - Pager - [phone]  After 7pm go to [email or web address] - password [facility]  [facility] -  Office  [phone]

## 2017-08-31 NOTE — Anesthesia Procedure Notes (Signed)
Procedure Name: MAC Date/Time: 08/31/2017 10:36 AM Performed by: Barrington Ellison, CRNA Pre-anesthesia Checklist: Patient identified, Emergency Drugs available, Suction available, Patient being monitored and Timeout performed Patient Re-evaluated:Patient Re-evaluated prior to induction Oxygen Delivery Method: Simple face mask Preoxygenation: Pre-oxygenation with 100% oxygen

## 2017-08-31 NOTE — Anesthesia Preprocedure Evaluation (Signed)
Anesthesia Evaluation  Patient identified by MRN, date of birth, ID band Patient awake    Reviewed: Allergy & Precautions, NPO status , Patient's Chart, lab work & pertinent test results  Airway Mallampati: II   Neck ROM: Full    Dental  (+) Edentulous Upper, Edentulous Lower   Pulmonary    breath sounds clear to auscultation       Cardiovascular hypertension,  Rhythm:Regular Rate:Normal     Neuro/Psych    GI/Hepatic   Endo/Other  diabetes  Renal/GU      Musculoskeletal   Abdominal (+) + obese,   Peds  Hematology   Anesthesia Other Findings   Reproductive/Obstetrics                             Anesthesia Physical Anesthesia Plan  ASA: III  Anesthesia Plan: Spinal   Post-op Pain Management:    Induction:   PONV Risk Score and Plan: Ondansetron and Dexamethasone  Airway Management Planned: Natural Airway and Simple Face Mask  Additional Equipment:   Intra-op Plan:   Post-operative Plan:   Informed Consent: I have reviewed the patients History and Physical, chart, labs and discussed the procedure including the risks, benefits and alternatives for the proposed anesthesia with the patient or authorized representative who has indicated his/her understanding and acceptance.     Plan Discussed with: CRNA and Anesthesiologist  Anesthesia Plan Comments:         Anesthesia Quick Evaluation

## 2017-08-31 NOTE — Anesthesia Procedure Notes (Signed)
Spinal  Patient location during procedure: OR Start time: 08/31/2017 10:55 AM End time: 08/31/2017 11:00 AM Staffing Anesthesiologist: Kipp BroodJoslin, Aaleyah Witherow, MD Performed: anesthesiologist  Preanesthetic Checklist Completed: patient identified, site marked, surgical consent, pre-op evaluation, timeout performed, IV checked, risks and benefits discussed and monitors and equipment checked Spinal Block Patient position: right lateral decubitus Prep: ChloraPrep Patient monitoring: heart rate, cardiac monitor, continuous pulse ox and blood pressure Approach: midline Location: L3-4 Injection technique: single-shot Needle Needle type: Tuohy  Needle gauge: 22 G Needle length: 9 cm Assessment Sensory level: T6 Additional Notes 14 mg. 0.75% Bupivacaine injected easily

## 2017-08-31 NOTE — Transfer of Care (Signed)
Immediate Anesthesia Transfer of Care Note  Patient: Tracy MassedLinda Vrooman  Procedure(s) Performed: INTRAMEDULLARY (IM) NAIL INTERTROCHANTRIC (Right Hip)  Patient Location: PACU  Anesthesia Type:Spinal  Level of Consciousness: awake  Airway & Oxygen Therapy: Patient Spontanous Breathing and Patient connected to face mask oxygen  Post-op Assessment: Report given to RN  Post vital signs: Reviewed  Last Vitals:  Vitals Value Taken Time  BP 72/19 08/31/2017 11:51 AM  Temp    Pulse 70 08/31/2017 11:52 AM  Resp 13 08/31/2017 11:52 AM  SpO2 100 % 08/31/2017 11:52 AM  Vitals shown include unvalidated device data.  Last Pain:  Vitals:   08/31/17 0825  TempSrc:   PainSc: 4          Complications: low BP Dr Noreene LarssonJoslin notified

## 2017-08-31 NOTE — Brief Op Note (Signed)
08/31/2017  11:50 AM  PATIENT:  Tracy MassedLinda Sullivan  67 y.o. female  PRE-OPERATIVE DIAGNOSIS:  right hip fx  POST-OPERATIVE DIAGNOSIS:  right hip fx  PROCEDURE:  Procedure(s): INTRAMEDULLARY (IM) NAIL INTERTROCHANTRIC (Right)  SURGEON:  Surgeon(s) and Role:    Kathryne Hitch* Dori Devino Y, MD - Primary  ANESTHESIA:   spinal  EBL:  100 mL   COUNTS:  YES  DICTATION: .Other Dictation: Dictation Number [id number]  PLAN OF CARE: Admit to inpatient   PATIENT DISPOSITION:  PACU - hemodynamically stable.   Delay start of Pharmacological VTE agent (>24hrs) due to surgical blood loss or risk of bleeding: no

## 2017-09-01 ENCOUNTER — Encounter (HOSPITAL_COMMUNITY): Payer: Self-pay | Admitting: Orthopaedic Surgery

## 2017-09-01 LAB — CBC
HEMATOCRIT: 28.1 % — AB (ref 36.0–46.0)
Hemoglobin: 8.9 g/dL — ABNORMAL LOW (ref 12.0–15.0)
MCH: 29.8 pg (ref 26.0–34.0)
MCHC: 31.7 g/dL (ref 30.0–36.0)
MCV: 94 fL (ref 78.0–100.0)
PLATELETS: 121 10*3/uL — AB (ref 150–400)
RBC: 2.99 MIL/uL — AB (ref 3.87–5.11)
RDW: 13.9 % (ref 11.5–15.5)
WBC: 7.8 10*3/uL (ref 4.0–10.5)

## 2017-09-01 LAB — BASIC METABOLIC PANEL
ANION GAP: 8 (ref 5–15)
BUN: 27 mg/dL — ABNORMAL HIGH (ref 6–20)
CO2: 22 mmol/L (ref 22–32)
Calcium: 8.4 mg/dL — ABNORMAL LOW (ref 8.9–10.3)
Chloride: 103 mmol/L (ref 101–111)
Creatinine, Ser: 1.82 mg/dL — ABNORMAL HIGH (ref 0.44–1.00)
GFR, EST AFRICAN AMERICAN: 32 mL/min — AB (ref >60)
GFR, EST NON AFRICAN AMERICAN: 27 mL/min — AB (ref >60)
GLUCOSE: 287 mg/dL — AB (ref 65–99)
POTASSIUM: 4.8 mmol/L (ref 3.5–5.1)
Sodium: 133 mmol/L — ABNORMAL LOW (ref 135–145)

## 2017-09-01 LAB — GLUCOSE, CAPILLARY
GLUCOSE-CAPILLARY: 286 mg/dL — AB (ref 65–99)
GLUCOSE-CAPILLARY: 274 mg/dL — AB (ref 65–99)
GLUCOSE-CAPILLARY: 245 mg/dL — AB (ref 65–99)
Glucose-Capillary: 277 mg/dL — ABNORMAL HIGH (ref 65–99)
Glucose-Capillary: 336 mg/dL — ABNORMAL HIGH (ref 65–99)

## 2017-09-01 MED ORDER — INSULIN GLARGINE 100 UNIT/ML ~~LOC~~ SOLN
10.0000 [IU] | Freq: Every day | SUBCUTANEOUS | Status: DC
  Administered 2017-09-01 – 2017-09-02 (×2): 10 [IU] via SUBCUTANEOUS
  Filled 2017-09-01 (×2): qty 0.1

## 2017-09-01 NOTE — Evaluation (Addendum)
Physical Therapy Evaluation Patient Details Name: Tracy Sullivan MRN: [id number] DOB: [birth year] Today's Date: 09/01/2017   History of Present Illness  Pt is a 67 y/o female admitted after fall. Imaging revealed R intertrochanteric fracture. Pt is s/p R IM nail placement. PMH includes DM, CHF, CKD 3, and HTN.   Clinical Impression  Pt is s/p surgery above with deficits below. Pt limited secondary to pain this session, so mobility limited to chair. Pt requiring min to mod A for mobility with RW. Feel pt is currently a high fall risk and has history of falls at home. Recommend SNF at d/c to increase independence prior to return home; pt and family agreeable. Will continue to follow acutely to maximize functional mobility independence and safety.     Follow Up Recommendations SNF;Supervision for mobility/OOB    Equipment Recommendations  None recommended by PT    Recommendations for Other Services       Precautions / Restrictions Precautions Precautions: Fall Precaution Comments: Fall prior to admission and reports history of falls at home.  Restrictions Weight Bearing Restrictions: Yes RLE Weight Bearing: Weight bearing as tolerated      Mobility  Bed Mobility Overal bed mobility: Needs Assistance Bed Mobility: Supine to Sit     Supine to sit: Min assist     General bed mobility comments: Very inefficient movement, however, pt requesting to perform as much as she can on her own. Required UE assist to move RLE towards EOB and required min A for trunk elevation.   Transfers Overall transfer level: Needs assistance Equipment used: Rolling walker (2 wheeled) Transfers: Sit to/from [employer] Sit to Stand: Mod assist Stand pivot transfers: Min assist       General transfer comment: Mod A for lift assist and steadying. Verbal cues for safe hand placement. Min A for steadying throughout stand pivot to chair and required verval cues for sequencing. Pt shuffling with  RLE, as unable to lift RLE secondary to pain.   Ambulation/Gait             General Gait Details: NT secondary to pain.   Stairs            Wheelchair Mobility    Modified Rankin (Stroke Patients Only)       Balance Overall balance assessment: Needs assistance Sitting-balance support: No upper extremity supported;Feet supported Sitting balance-Leahy Scale: Fair     Standing balance support: Bilateral upper extremity supported;During functional activity Standing balance-Leahy Scale: Poor Standing balance comment: Reliant on BUE support and external assist.                              Pertinent Vitals/Pain Pain Assessment: 0-10 Pain Score: 8  Pain Location: R hip  Pain Descriptors / Indicators: Aching;Operative site guarding Pain Intervention(s): Limited activity within patient's tolerance;Monitored during session;Repositioned    Home Living Family/patient expects to be discharged to:: Private residence Living Arrangements: Children Available Help at Discharge: Family;Available 24 hours/day Type of Home: House Home Access: Stairs to enter Entrance Stairs-Rails: None Entrance Stairs-Number of Steps: 2 Home Layout: One level Home Equipment: Toilet riser;Cane - single point      Prior Function Level of Independence: Independent               Hand Dominance   Dominant Hand: Right    Extremity/Trunk Assessment   Upper Extremity Assessment Upper Extremity Assessment: Defer to OT evaluation    Lower Extremity  Assessment Lower Extremity Assessment: RLE deficits/detail;LLE deficits/detail RLE Deficits / Details: Deficits consistent with post op pain and weakness. Movement limited in RLE secondary to pain.  RLE Sensation: history of peripheral neuropathy LLE Sensation: history of peripheral neuropathy    Cervical / Trunk Assessment Cervical / Trunk Assessment: Normal  Communication   Communication: No difficulties  Cognition  Arousal/Alertness: Awake/alert Behavior During Therapy: WFL for tasks assessed/performed Overall Cognitive Status: Within Functional Limits for tasks assessed                                        General Comments General comments (skin integrity, edema, etc.): Pt's daughter present during session. Educated about SNF recommendations, and pt and pt's daughter agreeable.     Exercises General Exercises - Lower Extremity Ankle Circles/Pumps: AROM;Both;5 reps   Assessment/Plan    PT Assessment Patient needs continued PT services  PT Problem List Decreased strength;Decreased balance;Decreased mobility;Decreased activity tolerance;Decreased range of motion;Decreased knowledge of use of DME;Decreased knowledge of precautions;Pain       PT Treatment Interventions DME instruction;Gait training;Functional mobility training;Therapeutic activities;Therapeutic exercise;Balance training;Neuromuscular re-education;Patient/family education    PT Goals (Current goals can be found in the Care Plan section)  Acute Rehab PT Goals Patient Stated Goal: to get better  PT Goal Formulation: With patient Time For Goal Achievement: 09/15/17 Potential to Achieve Goals: Good    Frequency Min 3X/week   Barriers to discharge        Co-evaluation               AM-PAC PT "6 Clicks" Daily Activity  Outcome Measure Difficulty turning over in bed (including adjusting bedclothes, sheets and blankets)?: A Little Difficulty moving from lying on back to sitting on the side of the bed? : Unable Difficulty sitting down on and standing up from a chair with arms (e.g., wheelchair, bedside commode, etc,.)?: Unable Help needed moving to and from a bed to chair (including a wheelchair)?: A Little Help needed walking in hospital room?: A Lot Help needed climbing 3-5 steps with a railing? : A Lot 6 Click Score: 12    End of Session Equipment Utilized During Treatment: Gait belt Activity  Tolerance: Patient limited by pain Patient left: in chair;with call bell/phone within reach;with chair alarm set;with family/visitor present Nurse Communication: Mobility status PT Visit Diagnosis: Other abnormalities of gait and mobility (R26.89);Pain;Muscle weakness (generalized) (M62.81);History of falling (Z91.81) Pain - Right/Left: Right Pain - part of body: Hip    Time: [id number] PT Time Calculation (min) (ACUTE ONLY): 23 min   Charges:   PT Evaluation $PT Eval Moderate Complexity: 1 Mod PT Treatments $Therapeutic Activity: 8-22 mins   PT G Codes:        Gladys DammeBrittany Melanye Hiraldo, PT, DPT  Acute Rehabilitation Services  Pager: [phone]   Lehman PromBrittany S Kiandria Clum 09/01/2017, 12:00 PM

## 2017-09-01 NOTE — Discharge Instructions (Signed)
Increase activities as comfort allows. Full weight bearing as tolerated. Up only with a walker and assistance. Can get incisions wet in the shower. Dry dressings as needed.

## 2017-09-01 NOTE — Progress Notes (Signed)
Subjective: 1 Day Post-Op Procedure(s) (LRB): INTRAMEDULLARY (IM) NAIL INTERTROCHANTRIC (Right) Patient reports pain as moderate.  Acute blood loss anemia from her fracture and surgery as well as other medical co-morbidities.  Surgery was yesterday.  Objective: Vital signs in last 24 hours: Temp:  [97.5 F (36.4 C)-98.9 F (37.2 C)] 97.7 F (36.5 C) (04/22 0429) Pulse Rate:  [59-85] 66 (04/22 0429) Resp:  [9-18] 18 (04/22 0429) BP: (55-122)/(19-57) 94/35 (04/22 0429) SpO2:  [91 %-100 %] 92 % (04/22 0429) Weight:  [198 lb 13.7 oz (90.2 kg)] 198 lb 13.7 oz (90.2 kg) (04/22 0429)  Intake/Output from previous day: 04/21 0701 - 04/22 0700 In: 1399.3 [I.V.:1299.3; IV Piggyback:100] Out: 400 [Urine:300; Blood:100] Intake/Output this shift: No intake/output data recorded.  Recent Labs    08/30/17 2117 09/01/17 0456  HGB 11.8* 8.9*   Recent Labs    08/30/17 2117 09/01/17 0456  WBC 6.9 7.8  RBC 3.94 2.99*  HCT 37.3 28.1*  PLT 158 121*   Recent Labs    08/30/17 2117 09/01/17 0456  NA 139 133*  K 4.3 4.8  CL 101 103  CO2 25 22  BUN 29* 27*  CREATININE 1.52* 1.82*  GLUCOSE 85 287*  CALCIUM 9.1 8.4*   Recent Labs    08/30/17 2117  INR 1.02    Sensation intact distally Intact pulses distally Dorsiflexion/Plantar flexion intact Incision: scant drainage  Assessment/Plan: 1 Day Post-Op Procedure(s) (LRB): INTRAMEDULLARY (IM) NAIL INTERTROCHANTRIC (Right) Up with therapy - attempt full WBAT right hip Will likely need skilled nursing post her acute hospital stay - has had balance issues.    Tracy Sullivan 09/01/2017, 7:37 AM

## 2017-09-01 NOTE — Op Note (Signed)
NAME:  Tracy Sullivan, Tracy Sullivan                       ACCOUNT NO.:  MEDICAL RECORD NO.:  [id number]  LOCATION:                                 FACILITY:  PHYSICIAN:  Vanita PandaChristopher Y. Magnus IvanBlackman, M.D.DATE OF BIRTH:  DATE OF PROCEDURE:  08/31/2017 DATE OF DISCHARGE:                              OPERATIVE REPORT   PREOPERATIVE DIAGNOSIS:  Right hip intertrochanteric femur fracture.  POSTOPERATIVE DIAGNOSIS:  Right hip intertrochanteric femur fracture.  PROCEDURES:  Intramedullary nail placement and lag screw placement, right hip.  IMPLANTS:  Stryker Gamma nail measuring 10 mm x 340 mm with 110.5 x 100 mm lag screw.  SURGEON:  Vanita PandaChristopher Y. Magnus IvanBlackman, M.D.  ANESTHESIA:  Spinal.  ANTIBIOTICS:  2 g of IV Ancef.  BLOOD LOSS:  Less than 200 mL.  COMPLICATIONS:  None.  INDICATIONS:  Ms. Tracy Sullivan is a 67 year old female, who sustained an accidental mechanical fall yesterday landing hard on her right hip.  She was seen at outlying hospital and found to have intertrochanteric hip fracture of her right proximal femur.  [facility] and admitted to the Medicine Service for definitive care by Orthopedics as well.  I talked to her and her family about surgery and we had a long and thorough discussion about the risks and benefits of this type of surgery.  After this discussion, they did wish to proceed with surgery.  PROCEDURE DESCRIPTION:  After informed consent was obtained, appropriate right hip was marked.  She was brought to the operating room.  Spinal anesthesia was obtained while she was on her stretcher.  Next, she was placed supine on the fracture table with the perineal post in place and the right operative leg in in-line skeletal traction, left leg in appropriate padding and a well leg holder.  We then assessed the fracture under direct fluoroscopy and was able to reduce with traction and internal rotation.  We then chose our nail under direct fluoroscopy as well  keeping it sterile in a box choosing a Stryker 10 mm x 340 mm femoral nail for a right hip keeping again sterile in the box.  We then passed it off sterilely up to the back table.  The right hip was then prepped and draped with DuraPrep and sterile drapes.  Time-out was called and she was identified as correct patient and correct right hip. I then made an incision just proximal to the greater trochanter and dissected down the soft tissues.  I could touch the tip of the greater trochanter with my finger.  I then under direct fluoroscopy placed temporary guide pin from an antegrade fashion down into the just past the lesser trochanter.  Using initiating reamer, we then opened up the femoral canal and removed the guide pin.  I then easily placed the long femoral nail in an antegrade fashion down the middle of the canal again verified displacement under direct fluoroscopy.  Through a 2nd lateral incision, we then were able to place a temporary guide pin traversing the fracture and the femoral neck into good position in the femoral head which was just slightly inferior and slightly posterior in her better  bone quality.  After this, we took a measurement and chose a 100 mm lag screw.  We drilled to the depth for this and then placed a lag screw without difficulty.  We then let some traction off the bed and placed the compression component as well.  We then removed all instrumentation. I was able to put the hip through internal and external rotation and it moved well as a unit.  We then again verified placement even down to the knee that we were in good position.  We then irrigated 2 small wounds with normal saline solution, closed the deep tissue with 2-0 Vicryl, followed by 2-0 Vicryl in the subcutaneous tissue, interrupted staples on the skin.  Xeroform and well-padded sterile dressing was applied. She was taken off the fracture table and taken to the recovery room in stable condition.  All  final counts were correct.  There were no complications noted.     Vanita Panda. Magnus Ivan, M.D.     CYB/MEDQ  D:  08/31/2017  T:  09/01/2017  Job:  [id number]

## 2017-09-01 NOTE — Progress Notes (Signed)
PROGRESS NOTE                                                                                                                                                                                                             Patient Demographics:    Tracy Sullivan, is a 67 y.o. female, DOB - November 22, 1950, VQQ:[id number]  Admit date - 08/30/2017   Admitting Physician Briscoe Deutscherimothy S Opyd, MD  Outpatient Primary MD for the patient is Irene ShipperBellingham, Daniel D, MD  LOS - 2   Chief Complaint  Patient presents with  . Fall       Brief Narrative   Tracy Sullivan is a 67 y.o. female with medical history significant for CHF, insulin-dependent diabetes mellitus, hypertension, osteoarthritis, and hypothyroidism, ince with right hip post mechanical fall.    Subjective:    Tracy Sullivan today reports hip pain is controlled, no chest pain, no shortness of breath, no nausea or vomiting   Assessment  & Plan :    Principal Problem:   Closed right hip fracture, initial encounter (HCC) Active Problems:   Diabetes mellitus without complication (HCC)   Hypertension   CKD (chronic kidney disease), stage III (HCC)   Chronic CHF (HCC)   Hypothyroidism    Right hip fracture  - Presents with severe right hip pain after a mechanical fall at home  - Status post surgical repair by Dr. Roe RutherfordBlackman,continue with pain medication when necessary,ET consulted, awaiting recommendation, likely will need SNF placement - DVT prophylaxis per primary orthopedic team, continue with aspirin.  Dilated nonischemic cardiomyopathy/chronic systolic CHF - I have discussed with her cardiology office and obtained records, [facility], Dr. Clance BolleWeese), currently patient with history of nonischemic dilated cardiomyopathy, then EF was 20%, cardiac cath with normal coronaries in 2016 , apparently most recent echo was elevated this month on 08/18/2017, with EF 55%, with no significant valvular disease. - Hold  lisinopril, continue Coreg as tolerated    CKD stage III  - SCr is 1.52 on admission,Her creatinine has increased to 1.87 today, her baseline appears to be 1.8 per records obtained from cardiology office, for now I will continue to hold lisinopril, and nephrotoxic medications.  Insulin-dependent DM  - Managed at home with Humulin 70/30 20 units qD and Ozempic  - Insulin sliding scale during hospital stay, CBG and controlled, started on Lantus  Hypothyroidism  - Continue Synthroid   Hypertension  - blood pressure remains on the lower side, continue hold lisinopril and Norvasc, continue with Coregas tolerated.      Code Status : DO NOT RESUSCITATE  Family Communication  : none at bedside  Disposition Plan  :  will need SNF placement  Consults  :  orthopedic  Procedures  : INTRAMEDULLARY (IM) NAIL INTERTROCHANTRIC (Right)by Dr. Magnus Ivan for 2119    DVT Prophylaxis  :  Aspirin- SCDs   Lab Results  Component Value Date   PLT 121 (L) 09/01/2017    Antibiotics  :    Anti-infectives (From admission, onward)   Start     Dose/Rate Route Frequency Ordered Stop   08/31/17 1700  ceFAZolin (ANCEF) IVPB 2g/100 mL premix     2 g 200 mL/hr over 30 Minutes Intravenous Every 6 hours 08/31/17 1221 09/01/17 0056        Objective:   Vitals:   08/31/17 1413 08/31/17 2045 09/01/17 0036 09/01/17 0429  BP: (!) 109/54 (!) 118/55 (!) 122/55 (!) 94/35  Pulse: 70 85 78 66  Resp:  15 16 18   Temp: 97.6 F (36.4 C) 98.9 F (37.2 C) 98.4 F (36.9 C) 97.7 F (36.5 C)  TempSrc: Oral Oral Oral Oral  SpO2: 94% 95% 91% 92%  Weight:    90.2 kg (198 lb 13.7 oz)  Height:        Wt Readings from Last 3 Encounters:  09/01/17 90.2 kg (198 lb 13.7 oz)     Intake/Output Summary (Last 24 hours) at 09/01/2017 0944 Last data filed at 09/01/2017 0500 Gross per 24 hour  Intake 1399.34 ml  Output 400 ml  Net 999.34 ml     Physical Exam  Awake Alert, Oriented X 3, No new F.N deficits,  Normal affect Good air  entry bilaterally, clear to auscultation, symmetrical chest wall movement RRR,no rubs, murmur or gallops +ve B.Sounds, Abd Soft, No tenderness,  guarding or rigidity. No Cyanosis, Clubbing or edema, No new Rash or bruise  , Sensation intact distally, intact pulses distally,    Data Review:    CBC Recent Labs  Lab 08/30/17 2117 09/01/17 0456  WBC 6.9 7.8  HGB 11.8* 8.9*  HCT 37.3 28.1*  PLT 158 121*  MCV 94.7 94.0  MCH 29.9 29.8  MCHC 31.6 31.7  RDW 13.8 13.9  LYMPHSABS 1.6  --   MONOABS 0.8  --   EOSABS 0.2  --   BASOSABS 0.0  --     Chemistries  Recent Labs  Lab 08/30/17 2117 09/01/17 0456  NA 139 133*  K 4.3 4.8  CL 101 103  CO2 25 22  GLUCOSE 85 287*  BUN 29* 27*  CREATININE 1.52* 1.82*  CALCIUM 9.1 8.4*  AST 25  --   ALT 19  --   ALKPHOS 70  --   BILITOT 1.0  --    ------------------------------------------------------------------------------------------------------------------ No results for input(s): CHOL, HDL, LDLCALC, TRIG, CHOLHDL, LDLDIRECT in the last 72 hours.  No results found for: HGBA1C ------------------------------------------------------------------------------------------------------------------ No results for input(s): TSH, T4TOTAL, T3FREE, THYROIDAB in the last 72 hours.  Invalid input(s): FREET3 ------------------------------------------------------------------------------------------------------------------ No results for input(s): VITAMINB12, FOLATE, FERRITIN, TIBC, IRON, RETICCTPCT in the last 72 hours.  Coagulation profile Recent Labs  Lab 08/30/17 2117  INR 1.02    No results for input(s): DDIMER in the last 72 hours.  Cardiac Enzymes No results for input(s): CKMB, TROPONINI, MYOGLOBIN in the last 168 hours.  Invalid  input(s): CK ------------------------------------------------------------------------------------------------------------------ No results found for: BNP  Inpatient  Medications  Scheduled Meds: . aspirin EC  325 mg Oral Q breakfast  . atorvastatin  80 mg Oral q1800  . carvedilol  25 mg Oral BID WC  . docusate sodium  100 mg Oral BID  . gabapentin  300 mg Oral TID  . insulin aspart  0-9 Units Subcutaneous Q4H  . levothyroxine  88 mcg Oral QAC breakfast  . pantoprazole  40 mg Oral Daily  . traMADol  50 mg Oral Q6H   Continuous Infusions: . methocarbamol (ROBAXIN)  IV    . methocarbamol (ROBAXIN)  IV     PRN Meds:.acetaminophen, bisacodyl, HYDROcodone-acetaminophen, HYDROcodone-acetaminophen, menthol-cetylpyridinium **OR** phenol, methocarbamol **OR** methocarbamol (ROBAXIN)  IV, methocarbamol **OR** methocarbamol (ROBAXIN)  IV, metoCLOPramide **OR** metoCLOPramide (REGLAN) injection, morphine injection, morphine injection, ondansetron, ondansetron **OR** ondansetron (ZOFRAN) IV, polyethylene glycol, senna-docusate  Micro Results No results found for this or any previous visit (from the past 240 hour(s)).  Radiology Reports Dg Chest 1 View  Result Date: 08/30/2017 CLINICAL DATA:  Recent fall EXAM: CHEST  1 VIEW COMPARISON:  None. FINDINGS: Cardiac shadow is within normal limits. Defibrillator is noted in satisfactory position. The lungs are well aerated bilaterally. No acute bony abnormality is seen. IMPRESSION: No acute abnormality noted. Electronically Signed   By: Alcide Clever M.D.   On: 08/30/2017 21:28   Dg Knee Complete 4 Views Right  Result Date: 08/30/2017 CLINICAL DATA:  Recent fall with proximal right femoral fracture EXAM: RIGHT KNEE - COMPLETE 3 VIEW COMPARISON:  None. FINDINGS: No acute fracture or dislocation is noted. No joint effusion is seen. No soft tissue abnormality is noted. IMPRESSION: No acute abnormality noted. Electronically Signed   By: Alcide Clever M.D.   On: 08/30/2017 20:40   Dg C-arm 1-60 Min  Result Date: 08/31/2017 CLINICAL DATA:  Intraoperative imaging for fixation of a right intertrochanteric fracture the patient  suffered in a fall yesterday. Initial encounter. EXAM: DG C-ARM 61-120 MIN; RIGHT FEMUR 2 VIEWS COMPARISON:  Plain films right hip this same day. FINDINGS: Five fluoroscopic intraoperative spot views demonstrate a hip screw and intramedullary nail in place for fixation of a right intertrochanteric fracture. Position alignment are anatomic. No acute finding. IMPRESSION: ORIF right hip fracture.  No acute finding. Electronically Signed   By: Drusilla Kanner M.D.   On: 08/31/2017 13:36   Dg Hip Unilat W Or Wo Pelvis 2-3 Views Right  Result Date: 08/30/2017 CLINICAL DATA:  Recent trip and fall with right hip pain, initial encounter EXAM: DG HIP (WITH OR WITHOUT PELVIS) 3V RIGHT COMPARISON:  None. FINDINGS: Comminuted intratrochanteric fracture is noted with mild impaction and angulation. No soft tissue abnormality is seen. No other bony abnormality is noted. IMPRESSION: Right intratrochanteric femoral fracture Electronically Signed   By: Alcide Clever M.D.   On: 08/30/2017 20:39   Dg Femur, Min 2 Views Right  Result Date: 08/31/2017 CLINICAL DATA:  Intraoperative imaging for fixation of a right intertrochanteric fracture the patient suffered in a fall yesterday. Initial encounter. EXAM: DG C-ARM 61-120 MIN; RIGHT FEMUR 2 VIEWS COMPARISON:  Plain films right hip this same day. FINDINGS: Five fluoroscopic intraoperative spot views demonstrate a hip screw and intramedullary nail in place for fixation of a right intertrochanteric fracture. Position alignment are anatomic. No acute finding. IMPRESSION: ORIF right hip fracture.  No acute finding. Electronically Signed   By: Drusilla Kanner M.D.   On: 08/31/2017 13:36    Time Spent in  minutes  25 min   Huey Bienenstock M.D on 09/01/2017 at 9:44 AM  Between 7am to 7pm - Pager - [phone]  After 7pm go to [email or web address] - password [facility]  [facility] -  Office  [phone]

## 2017-09-01 NOTE — Progress Notes (Signed)
Nutrition Brief Note  RD consulted via Hip Fracture Protocol.  Wt Readings from Last 15 Encounters:  09/01/17 198 lb 13.7 oz (90.2 kg)   Body mass index is 36.37 kg/m. Patient meets criteria for Obesity Class II based on current BMI.   Current diet order is Carbohydrate Modified, patient is consuming approximately 100% of meals at this time. Labs and medications reviewed.   No nutrition interventions warranted at this time. If nutrition issues arise, please consult RD.   Maureen ChattersKatie Manuelita Moxon, RD, LDN Pager #: [phone] After-Hours Pager #: [phone]

## 2017-09-01 NOTE — Evaluation (Signed)
Occupational Therapy Evaluation Patient Details Name: Tracy Sullivan MRN: [id number] DOB: 04-21-1951 Today's Date: 09/01/2017    History of Present Illness Pt is a 67 y/o female admitted after fall. Imaging revealed R intertrochanteric fracture. Pt is s/p R IM nail placement. PMH includes DM, CHF, CKD 3, and HTN.    Clinical Impression   Patient is s/p R IM  surgery resulting in functional limitations due to the deficits listed below (see OT problem list). Pt currently requires total (A) for all LB adls and min (A) for basic transfer. Pt is unable to transfer to the bathroom and rather needs BSC for stand pivot only.  Patient will benefit from skilled OT acutely to increase independence and safety with ADLS to allow discharge SNF.     Follow Up Recommendations  SNF    Equipment Recommendations  3 in 1 bedside commode;Other (comment)(rw)    Recommendations for Other Services       Precautions / Restrictions Precautions Precautions: Fall Precaution Comments: Fall prior to admission and reports history of falls at home.  Restrictions Weight Bearing Restrictions: No RLE Weight Bearing: Weight bearing as tolerated      Mobility Bed Mobility Overal bed mobility: Needs Assistance Bed Mobility: Supine to Sit     Supine to sit: Min assist     General bed mobility comments: in chair on arrival. educated on sheet as leg lift in supine and to help get in / out of bed with demo  Transfers Overall transfer level: Needs assistance Equipment used: Rolling walker (2 wheeled) Transfers: Sit to/from Stand Sit to Stand: Min assist Stand pivot transfers: Min assist       General transfer comment: pt needed verbal cue for hand placement pt states you just stand right there and make sure this don t move on me    Balance Overall balance assessment: Needs assistance Sitting-balance support: No upper extremity supported;Feet supported Sitting balance-Leahy Scale: Fair     Standing  balance support: Bilateral upper extremity supported;During functional activity Standing balance-Leahy Scale: Poor Standing balance comment: Reliant on BUE support and external assist.                            ADL either performed or assessed with clinical judgement   ADL Overall ADL's : Needs assistance/impaired Eating/Feeding: Independent   Grooming: Set up;Sitting   Upper Body Bathing: Minimal assistance Upper Body Bathing Details (indicate cue type and reason): seated requires Lower Body Bathing: Total assistance       Lower Body Dressing: Total assistance Lower Body Dressing Details (indicate cue type and reason): will need education on AE for LB dressing/ bathing  Toilet Transfer: Minimal assistance;Stand-pivot;BSC Toilet Transfer Details (indicate cue type and reason): pt unable to ambulate 10 ft to the bathroom this session. pt made it about 4 feet and stated "i cant do it . i need a chair"          Functional mobility during ADLs: Minimal assistance;Rolling walker General ADL Comments: Pt pleasant and eager to do all therapy. pt fatigued after attempted bathroom transfer. pt states "maybe tomorrow and if not that the next day"     Vision   Vision Assessment?: No apparent visual deficits     Perception     Praxis      Pertinent Vitals/Pain Pain Assessment: Faces Pain Score: 8  Faces Pain Scale: Hurts little more Pain Location: R hip  Pain Descriptors / Indicators: Operative  site guarding Pain Intervention(s): Monitored during session;Premedicated before session;Repositioned;Ice applied     Hand Dominance Right   Extremity/Trunk Assessment Upper Extremity Assessment Upper Extremity Assessment: Overall WFL for tasks assessed   Lower Extremity Assessment Lower Extremity Assessment: Defer to PT evaluation RLE Deficits / Details: reports trouble with hip flexion. pt encouraged to complete hip flexion knee extension and knee flexion x3 during the  day and sheet at this time to help with activation of hip flexion  RLE Sensation: history of peripheral neuropathy LLE Sensation: history of peripheral neuropathy   Cervical / Trunk Assessment Cervical / Trunk Assessment: Normal   Communication Communication Communication: No difficulties   Cognition Arousal/Alertness: Awake/alert Behavior During Therapy: WFL for tasks assessed/performed Overall Cognitive Status: Within Functional Limits for tasks assessed                                     General Comments  ice applied to woudn    Exercises Exercises: General Lower Extremity General Exercises - Lower Extremity Ankle Circles/Pumps: AROM;Both;5 reps   Shoulder Instructions      Home Living Family/patient expects to be discharged to:: Skilled nursing facility Living Arrangements: Children Available Help at Discharge: Family;Available 24 hours/day Type of Home: House Home Access: Stairs to enter [employer] of Steps: 2 Entrance Stairs-Rails: None Home Layout: One level     Bathroom Shower/Tub: Producer, television/film/video: Standard     Home Equipment: Toilet riser;Cane - single point          Prior Functioning/Environment Level of Independence: Independent                 OT Problem List: Decreased strength;Decreased activity tolerance;Impaired balance (sitting and/or standing);Decreased safety awareness;Decreased knowledge of use of DME or AE;Decreased knowledge of precautions;Obesity;Pain      OT Treatment/Interventions: Self-care/ADL training;Therapeutic exercise;DME and/or AE instruction;Energy conservation;Therapeutic activities;Patient/family education;Balance training    OT Goals(Current goals can be found in the care plan section) Acute Rehab OT Goals Patient Stated Goal: to do it tomorrow OT Goal Formulation: With patient Time For Goal Achievement: 09/15/17 Potential to Achieve Goals: Good  OT Frequency: Min  2X/week   Barriers to D/C:            Co-evaluation              AM-PAC PT "6 Clicks" Daily Activity     Outcome Measure Help from another person eating meals?: None Help from another person taking care of personal grooming?: A Little Help from another person toileting, which includes using toliet, bedpan, or urinal?: A Lot Help from another person bathing (including washing, rinsing, drying)?: A Lot Help from another person to put on and taking off regular upper body clothing?: A Lot Help from another person to put on and taking off regular lower body clothing?: A Lot 6 Click Score: 15   End of Session Equipment Utilized During Treatment: Gait belt;Rolling walker Nurse Communication: Mobility status;Precautions  Activity Tolerance: Patient tolerated treatment well Patient left: in chair;with call bell/phone within reach;with family/visitor present  OT Visit Diagnosis: Unsteadiness on feet (R26.81);Repeated falls (R29.6)                Time: 1610-9604 OT Time Calculation (min): 14 min Charges:  OT General Charges $OT Visit: 1 Visit OT Evaluation $OT Eval Moderate Complexity: 1 Mod G-Codes:      Mateo Flow   OTR/L  Pager: [phone] Office: [phone] .   Boone Master B 09/01/2017, 2:43 PM

## 2017-09-01 NOTE — Progress Notes (Signed)
Chaplain visited with the PT at request of nurse to complete an AD.  The PT wanted to wait until her daughter was present to complete.  Paperwork is due to be finished tomorrow.  Chaplain took the time to explain all aspects of the AD and what it means to the PT.

## 2017-09-02 ENCOUNTER — Encounter (HOSPITAL_COMMUNITY): Payer: Self-pay | Admitting: General Practice

## 2017-09-02 LAB — BASIC METABOLIC PANEL
ANION GAP: 7 (ref 5–15)
BUN: 28 mg/dL — ABNORMAL HIGH (ref 6–20)
CALCIUM: 8.6 mg/dL — AB (ref 8.9–10.3)
CO2: 24 mmol/L (ref 22–32)
CREATININE: 1.67 mg/dL — AB (ref 0.44–1.00)
Chloride: 105 mmol/L (ref 101–111)
GFR calc Af Amer: 35 mL/min — ABNORMAL LOW (ref >60)
GFR, EST NON AFRICAN AMERICAN: 30 mL/min — AB (ref >60)
GLUCOSE: 217 mg/dL — AB (ref 65–99)
Potassium: 4.7 mmol/L (ref 3.5–5.1)
Sodium: 136 mmol/L (ref 135–145)

## 2017-09-02 LAB — GLUCOSE, CAPILLARY
GLUCOSE-CAPILLARY: 222 mg/dL — AB (ref 65–99)
Glucose-Capillary: 176 mg/dL — ABNORMAL HIGH (ref 65–99)
Glucose-Capillary: 182 mg/dL — ABNORMAL HIGH (ref 65–99)
Glucose-Capillary: 217 mg/dL — ABNORMAL HIGH (ref 65–99)
Glucose-Capillary: 225 mg/dL — ABNORMAL HIGH (ref 65–99)

## 2017-09-02 LAB — CBC
HCT: 25.9 % — ABNORMAL LOW (ref 36.0–46.0)
Hemoglobin: 8.2 g/dL — ABNORMAL LOW (ref 12.0–15.0)
MCH: 30 pg (ref 26.0–34.0)
MCHC: 31.7 g/dL (ref 30.0–36.0)
MCV: 94.9 fL (ref 78.0–100.0)
PLATELETS: 121 10*3/uL — AB (ref 150–400)
RBC: 2.73 MIL/uL — ABNORMAL LOW (ref 3.87–5.11)
RDW: 14 % (ref 11.5–15.5)
WBC: 7.9 10*3/uL (ref 4.0–10.5)

## 2017-09-02 MED ORDER — HYDROCODONE-ACETAMINOPHEN 5-325 MG PO TABS: 1.0000 | ORAL_TABLET | ORAL | 0 refills | Status: DC | PRN

## 2017-09-02 MED ORDER — FERROUS SULFATE 325 (65 FE) MG PO TBEC: 325.0000 mg | DELAYED_RELEASE_TABLET | Freq: Two times a day (BID) | ORAL | 3 refills | Status: AC

## 2017-09-02 MED ORDER — LORATADINE 10 MG PO TABS: 10.0000 mg | ORAL_TABLET | Freq: Every day | ORAL | Status: AC

## 2017-09-02 MED ORDER — LISINOPRIL 20 MG PO TABS: 20.0000 mg | ORAL_TABLET | Freq: Every day | ORAL | Status: AC

## 2017-09-02 MED ORDER — POLYETHYLENE GLYCOL 3350 17 G PO PACK: 17.0000 g | PACK | Freq: Every day | ORAL | 0 refills | Status: AC | PRN

## 2017-09-02 MED ORDER — ATORVASTATIN CALCIUM 80 MG PO TABS: 80.0000 mg | ORAL_TABLET | Freq: Every day | ORAL | Status: AC

## 2017-09-02 MED ORDER — BISACODYL 5 MG PO TBEC: 5.0000 mg | DELAYED_RELEASE_TABLET | Freq: Every day | ORAL | 0 refills | Status: AC | PRN

## 2017-09-02 MED ORDER — ASPIRIN 325 MG PO TBEC: 325.0000 mg | DELAYED_RELEASE_TABLET | Freq: Every day | ORAL | 0 refills | Status: AC

## 2017-09-02 MED ORDER — SENNOSIDES-DOCUSATE SODIUM 8.6-50 MG PO TABS: 1.0000 | ORAL_TABLET | Freq: Two times a day (BID) | ORAL | Status: AC

## 2017-09-02 MED ORDER — METHOCARBAMOL 500 MG PO TABS: 500.0000 mg | ORAL_TABLET | Freq: Four times a day (QID) | ORAL | 0 refills | Status: AC | PRN

## 2017-09-02 NOTE — Clinical Social Work Note (Signed)
Clinical Social Work Assessment  Patient Details  Name: Tracy Sullivan MRN: [id number] Date of Birth: 10-09-1950  Date of referral:  09/02/17               Reason for consult:  Facility Placement                Permission sought to share information with:  Facility Art therapist granted to share information::  Yes, Verbal Permission Granted  Name::        Agency::  SNF  Relationship::     Contact Information:     Housing/Transportation Living arrangements for the past 2 months:  Single Family Home Source of Information:  Patient Patient Interpreter Needed:  None Criminal Activity/Legal Involvement Pertinent to Current Situation/Hospitalization:  No - Comment as needed Significant Relationships:  Adult Children, Other Family Members Lives with:  Adult Children Do you feel safe going back to the place where you live?  No Need for family participation in patient care:  No (Coment)  Care giving concerns:  Pt from home alone however, will go to home of daughter's after she gets her short term rehab.  Social Worker assessment / plan:  CSW met with patient at bedside and discussed the discharge plan.  Pt called daughter while in the patient's room on the phone and had the conference call. Pt agreeable to SNF. Pt indicated that she was independent with ADL's prior to hospitalization. CSW explained SNF process and placement. CSW discussed Civil Service fast streamer process.  Pt indicated that she would like to be sent out to [facility] and [facility].  CSW will f/u with disposition.  Employment status:  Retired Nurse, adult PT Recommendations:  [place] / Referral to community resources:  [place]  Patient/Family's Response to care:  Patient agreeable to SNF and thanked CSW for meeting.  Patient/Family's Understanding of and Emotional Response to Diagnosis, Current Treatment, and Prognosis:  Patient/family  has good understanding of impairment and agreeable to SNF.  Pt will go home once she has completed treatment at short term rehab and sty with daughter for support. CSW will f/u for offers and continue with disposition.  Emotional Assessment Appearance:  Appears stated age Attitude/Demeanor/Rapport:  (Cooperative) Affect (typically observed):  Accepting, Appropriate Orientation:  Oriented to Self, Oriented to Place, Oriented to  Time, Oriented to Situation Alcohol / Substance use:  Not Applicable Psych involvement (Current and /or in the community):  No (Comment)  Discharge Needs  Concerns to be addressed:  Discharge Planning Concerns Readmission within the last 30 days:  No Current discharge risk:  Dependent with Mobility, Physical Impairment Barriers to Discharge:  No Barriers Identified   Normajean Baxter, LCSW 09/02/2017, 10:16 AM

## 2017-09-02 NOTE — Clinical Social Work Placement (Signed)
   CLINICAL SOCIAL WORK PLACEMENT  NOTE  Date:  09/02/2017  Patient Details  Name: Tracy Sullivan MRN: [id number] Date of Birth: [birth year]  Clinical Social Work is seeking post-discharge placement for this patient at the Skilled  Nursing Facility level of care (*CSW will initial, date and re-position this form in  chart as items are completed):  Yes   Patient/family provided with [facility] Clinical Social Work Department's list of facilities offering this level of care within the geographic area requested by the patient (or if unable, by the patient's family).  Yes   Patient/family informed of their freedom to choose among providers that offer the needed level of care, that participate in Medicare, Medicaid or managed care program needed by the patient, have an available bed and are willing to accept the patient.  Yes   Patient/family informed of [facility] ownership interest in [facility] and [facility], as well as of the fact that they are under no obligation to receive care at these facilities.  PASRR submitted to EDS on       PASRR number received on 09/02/17     Existing PASRR number confirmed on       FL2 transmitted to all facilities in geographic area requested by pt/family on 09/02/17     FL2 transmitted to all facilities within larger geographic area on       Patient informed that his/her managed care company has contracts with or will negotiate with certain facilities, including the following:        Yes   Patient/family informed of bed offers received.  Patient [facility] bed at [facility]     Physician recommends and patient chooses bed at      Patient to be [facility] on 09/02/17.  Patient to be transferred to facility by PTAR     Patient family notified on 09/02/17 of transfer.  Name of family member notified:  pt responsible for self     PHYSICIAN       Additional Comment:     _______________________________________________ Tresa MoorePatricia V Nalah Macioce, LCSW 09/02/2017, 3:04 PM

## 2017-09-02 NOTE — Progress Notes (Signed)
Inpatient Diabetes Program Recommendations  AACE/ADA: New Consensus Statement on Inpatient Glycemic Control (2015)  Target Ranges:  Prepandial:   less than 140 mg/dL      Peak postprandial:   less than 180 mg/dL (1-2 hours)      Critically ill patients:  140 - 180 mg/dL   Results for Nehemiah MassedHILL, [place] (MRN [id number]) as of 09/02/2017 11:07  Ref. Range 08/31/2017 23:19 09/01/2017 04:34 09/01/2017 08:47 09/01/2017 11:28 09/01/2017 16:32 09/01/2017 19:56  Glucose-Capillary Latest Ref Range: 65 - 99 mg/dL 409286 (H) 811277 (H) 914336 (H) 286 (H) 274 (H) 245 (H)   Results for Nehemiah MassedHILL, [place] (MRN [id number]) as of 09/02/2017 11:07  Ref. Range 09/02/2017 00:24 09/02/2017 04:24 09/02/2017 07:56  Glucose-Capillary Latest Ref Range: 65 - 99 mg/dL 086217 (H) 578225 (H) 469222 (H)     Home DM Meds: Humulin 70/30 Insulin- 20 units daily        Ozempic 0.5 mg Qweek  Current Orders: Lantus 10 unit daily       Novolog Sensitive Correction Scale/ SSI (0-9 units) Q4 hours    MD- Please consider the following in-hospital insulin adjustments:  1. Increase Lantus to 15 units daily  2. Change Novolog SSI to TID AC + HS (currently ordered Q4 hours)  3. Start Novolog Meal Coverage: Novolog 3 units TID with meals (hold if pt eats <50% of meal)      --Will follow patient during hospitalization--  Ambrose FinlandJeannine Johnston Maanya Hippert RN, MSN, CDE Diabetes Coordinator Inpatient Glycemic Control Team Team Pager: [phone] (8a-5p)

## 2017-09-02 NOTE — Progress Notes (Signed)
This RN called to give report to Charlsie MerlesSheila Dodson, RN from [facility] at [phone]. All questions answered to satisfaction. Awaiting [facility] for transport. Will continue to monitor.

## 2017-09-02 NOTE — Discharge Summary (Signed)
Tracy Sullivan, is a 67 y.o. female  DOB August 11, 1950  MRN [id number].  Admission date:  08/30/2017  Admitting Physician  Briscoe Deutscher, MD  Discharge Date:  09/02/2017   Primary MD  Irene Shipper, MD  Recommendations for primary care physician for things to follow:  - please check CBC, BMP in 3 days. - Patient to follow with orthopedic in 1 week  Admission Diagnosis  Closed right hip fracture, initial encounter [facility]) [S72.001A]   Discharge Diagnosis  Closed right hip fracture, initial encounter (HCC) [S72.001A]    Principal Problem:   Closed right hip fracture, initial encounter [facility]) Active Problems:   Diabetes mellitus without complication (HCC)   Hypertension   CKD (chronic kidney disease), stage III (HCC)   Chronic CHF (HCC)   Hypothyroidism      Past Medical History:  Diagnosis Date  . Complication of anesthesia   . Diabetes mellitus without complication (HCC)   . Enlarged heart   . Hypertension   . Osteoporosis   . PONV (postoperative nausea and vomiting)   . Thyroid disease     Past Surgical History:  Procedure Laterality Date  . BACK SURGERY    . CARDIAC DEFIBRILLATOR PLACEMENT     defibrillator and pacemaker per pt  . CESAREAN SECTION    . INTRAMEDULLARY (IM) NAIL INTERTROCHANTERIC Right 08/31/2017   Procedure: INTRAMEDULLARY (IM) NAIL INTERTROCHANTRIC;  Surgeon: Kathryne Hitch, MD;  Location: [facility] OR;  Service: Orthopedics;  Laterality: Right;  . TOTAL ELBOW REPLACEMENT         History of present illness and  Hospital Course:     Kindly see H&P for history of present illness and admission details, please review complete Labs, Consult reports and Test reports for all details in brief  HPI  from the history and physical done on the day of admission  HPI: Tracy Sullivan is a 67 y.o. female with medical history significant for CHF, insulin-dependent diabetes  mellitus, hypertension, osteoarthritis, and hypothyroidism, now presenting to the emergency department for evaluation of severe right hip pain after a fall at home.  Patient had been in her usual state, was having an uneventful day, and tripped, falling onto her right side without hitting her head or losing consciousness.  She had immediate and severe pain at the right hip, prompting her to come into the ED for evaluation.  She reports that at her baseline, she does not experience any chest pain, but does become short of breath with walking several steps.  No recent fevers, chills, cough, vomiting, or diarrhea.  ED Course: Upon arrival to the ED, patient is found to be afebrile, saturating well on room air, and with vitals otherwise stable.  EKG features a paced rhythm and chest x-ray is negative for acute cardiopulmonary disease.  Radiographs of the right hip reveal intertrochanteric femoral fracture and radiographs of the knee are negative.  Chemistry panel is notable for creatinine 1.52, similar to her prior value.  CBC is unremarkable, urinalysis unremarkable, and INR is normal.  Orthopedic surgery was consulted by the ED physician and recommended transfer to [facility] where they will see the patient in consultation.     Hospital Course  Tracy Sullivan a 67 y.o.femalewith medical history significant forCHF, insulin-dependent diabetes mellitus, hypertension, osteoarthritis, and hypothyroidism, ince with right hip post mechanical fall.   Right hip fracture -Presents with severe right hip pain after a mechanical fall at home  - Status post surgical repair by Dr. Roe Rutherford with pain medication when necessary, PT consulted, recommendation has been made for SNF placement which is has been arranged . - DVT prophylaxis per primary orthopedic team, continue with aspirin 325 mg oral daily for 30 days, then change back to baby dose aspirin  Dilated nonischemic cardiomyopathy/chronic  systolic CHF - I have discussed with her cardiology office and obtained records, [facility], Dr. Clance Boll), currently patient with history of nonischemic dilated cardiomyopathy, then EF was 20%, cardiac cath with normal coronaries in 2016 , apparently most recent echo was elevated this month on 08/18/2017, with EF 55%, with no significant valvular disease. -Continue with Coreg, lisinopril  -Appears to be euvolemic, resume on home dose Lasix  CKD stage III -SCr is 1.52 on admission,Her creatinine has increased to 1.87 today, her baseline appears to be 1.8 per records obtained from cardiology office, for now I will continue to hold lisinopril, and nephrotoxic medications.  Insulin-dependent DM -Resume home regimen on discharge  Hypothyroidism -Continue Synthroid  Hypertension - blood pressure remains on the lower side, so I have stopped her Norvasc, lowered her lisinopril from twice daily to once daily, continue with Coreg .  Acute blood loss anemia -Supportive, expected, she will be discharged on iron supplements, hemoglobin is 8.2 on discharge      Discharge Condition:  stable   Follow UP   Contact information for follow-up providers    Kathryne Hitch, MD. Schedule an appointment as soon as possible for a visit in 2 week(s).   Specialty:  Orthopedic Surgery Contact information: [address] [place] [address] [phone]            Contact information for after-discharge care    Destination    [facility] .   Service:  Skilled Nursing Contact information: [address] [place] [address] [phone]                    Discharge Instructions  and  Discharge Medications     Discharge Instructions    Discharge instructions   Complete by:  As directed    Increase activities as comfort allows. Full weight bearing as tolerated. Up only with a walker and assistance. Can get  incisions wet in the shower. Dry dressings as needed.  Follow with Primary MD Irene Shipper, MD or SNF physicain in 3 days  Get CBC, CMP, checked  by Primary MD next visit.    Activity: As tolerated with Full fall precautions use walker/cane & assistance as needed   Disposition SNF   Diet: Heart Healthy carb modified , with feeding assistance and aspiration precautions.  For Heart failure patients - Check your Weight same time everyday, if you gain over 2 pounds, or you develop in leg swelling, experience more shortness of breath or chest pain, call your Primary MD immediately. Follow Cardiac Low Salt Diet and 1.5 lit/day fluid restriction.   On your next visit with your primary care physician please Get Medicines reviewed and adjusted.   Please request your  Prim.MD to go over all Hospital Tests and Procedure/Radiological results at the follow up, please get all Hospital records sent to your Prim MD by signing hospital release before you go home.   If you experience worsening of your admission symptoms, develop shortness of breath, life threatening emergency, suicidal or homicidal thoughts you must seek medical attention immediately by calling 911 or calling your MD immediately  if symptoms less severe.  You Must read complete instructions/literature along with all the possible adverse reactions/side effects for all the Medicines you take and that have been prescribed to you. Take any new Medicines after you have completely understood and accpet all the possible adverse reactions/side effects.   Do not drive, operating heavy machinery, perform activities at heights, swimming or participation in water activities or provide baby sitting services if your were admitted for syncope or siezures until you have seen by Primary MD or a Neurologist and advised to do so again.  Do not drive when taking Pain medications.    Do not take more than prescribed Pain, Sleep and Anxiety  Medications  Special Instructions: If you have smoked or chewed Tobacco  in the last 2 yrs please stop smoking, stop any regular Alcohol  and or any Recreational drug use.  Wear Seat belts while driving.   Please note  You were cared for by a hospitalist during your hospital stay. If you have any questions about your discharge medications or the care you received while you were in the hospital after you are discharged, you can call the unit and asked to speak with the hospitalist on call if the hospitalist that took care of you is not available. Once you are discharged, your primary care physician will handle any further medical issues. Please note that NO REFILLS for any discharge medications will be authorized once you are discharged, as it is imperative that you return to your primary care physician (or establish a relationship with a primary care physician if you do not have one) for your aftercare needs so that they can reassess your need for medications and monitor your lab values.     Allergies as of 09/02/2017      Reactions   Codeine Nausea Only   Tramadol Nausea Only      Medication List    STOP taking these medications   amLODipine 5 MG tablet Commonly known as:  NORVASC   aspirin 81 MG tablet Replaced by:  aspirin 325 MG EC tablet   HYDROcodone-acetaminophen 10-325 MG tablet Commonly known as:  NORCO Replaced by:  HYDROcodone-acetaminophen 5-325 MG tablet     TAKE these medications   acetaminophen 650 MG CR tablet Commonly known as:  TYLENOL Take 650 mg by mouth every 8 (eight) hours as needed for pain.   aspirin 325 MG EC tablet Take 1 tablet (325 mg total) by mouth daily with breakfast. Please take for 30 days, then change to 81 mg daily after that. Start taking on:  09/03/2017 Replaces:  aspirin 81 MG tablet   atorvastatin 80 MG tablet Commonly known as:  LIPITOR Take 1 tablet (80 mg total) by mouth daily at 6 PM. What changed:    medication strength  how  much to take  when to take this   bisacodyl 5 MG EC tablet Commonly known as:  DULCOLAX Take 1 tablet (5 mg total) by mouth daily as needed for moderate constipation.   carvedilol 25 MG tablet Commonly known as:  COREG   ferrous sulfate 325 (  65 FE) MG EC tablet Take 1 tablet (325 mg total) by mouth 2 (two) times daily.   furosemide 40 MG tablet Commonly known as:  LASIX Take 40 mg by mouth daily.   gabapentin 300 MG capsule Commonly known as:  NEURONTIN Take 300 mg by mouth 3 (three) times daily.   HUMULIN 70/30 (70-30) 100 UNIT/ML injection Generic drug:  insulin NPH-regular Human Inject 20 Units into the skin daily.   HYDROcodone-acetaminophen 5-325 MG tablet Commonly known as:  NORCO/VICODIN Take 1-2 tablets by mouth every 4 (four) hours as needed for moderate pain (pain score 4-6). Replaces:  HYDROcodone-acetaminophen 10-325 MG tablet   levothyroxine 88 MCG tablet Commonly known as:  SYNTHROID, LEVOTHROID Take 88 mcg by mouth daily.   lisinopril 20 MG tablet Commonly known as:  PRINIVIL,ZESTRIL Take 1 tablet (20 mg total) by mouth daily. What changed:  when to take this   loratadine 10 MG tablet Commonly known as:  CLARITIN Take 1 tablet (10 mg total) by mouth daily. What changed:  how much to take   methocarbamol 500 MG tablet Commonly known as:  ROBAXIN Take 1 tablet (500 mg total) by mouth every 6 (six) hours as needed for muscle spasms.   omeprazole 20 MG capsule Commonly known as:  PRILOSEC Take 20 mg by mouth daily.   OZEMPIC 0.25 or 0.5 MG/DOSE Sopn Generic drug:  Semaglutide Inject 0.5 Units into the skin once a week. Takes on tuesdays   polyethylene glycol packet Commonly known as:  MIRALAX / GLYCOLAX Take 17 g by mouth daily as needed for mild constipation.   senna-docusate 8.6-50 MG tablet Commonly known as:  Senokot-S Take 1 tablet by mouth 2 (two) times daily.   tiZANidine 4 MG tablet Commonly known as:  ZANAFLEX Take 4 mg by mouth  as needed.         Diet and Activity recommendation: See Discharge Instructions above   Consults obtained -  Orhto   Major procedures and Radiology Reports - PLEASE review detailed and final reports for all details, in brief -    Procedures  : INTRAMEDULLARY (IM) NAIL INTERTROCHANTRIC (Right)by Dr. Magnus Ivan for 2119     Dg Chest 1 View  Result Date: 08/30/2017 CLINICAL DATA:  Recent fall EXAM: CHEST  1 VIEW COMPARISON:  None. FINDINGS: Cardiac shadow is within normal limits. Defibrillator is noted in satisfactory position. The lungs are well aerated bilaterally. No acute bony abnormality is seen. IMPRESSION: No acute abnormality noted. Electronically Signed   By: Alcide Clever M.D.   On: 08/30/2017 21:28   Dg Knee Complete 4 Views Right  Result Date: 08/30/2017 CLINICAL DATA:  Recent fall with proximal right femoral fracture EXAM: RIGHT KNEE - COMPLETE 3 VIEW COMPARISON:  None. FINDINGS: No acute fracture or dislocation is noted. No joint effusion is seen. No soft tissue abnormality is noted. IMPRESSION: No acute abnormality noted. Electronically Signed   By: Alcide Clever M.D.   On: 08/30/2017 20:40   Dg C-arm 1-60 Min  Result Date: 08/31/2017 CLINICAL DATA:  Intraoperative imaging for fixation of a right intertrochanteric fracture the patient suffered in a fall yesterday. Initial encounter. EXAM: DG C-ARM 61-120 MIN; RIGHT FEMUR 2 VIEWS COMPARISON:  Plain films right hip this same day. FINDINGS: Five fluoroscopic intraoperative spot views demonstrate a hip screw and intramedullary nail in place for fixation of a right intertrochanteric fracture. Position alignment are anatomic. No acute finding. IMPRESSION: ORIF right hip fracture.  No acute finding. Electronically Signed  By: Drusilla Kanner M.D.   On: 08/31/2017 13:36   Dg Hip Unilat W Or Wo Pelvis 2-3 Views Right  Result Date: 08/30/2017 CLINICAL DATA:  Recent trip and fall with right hip pain, initial encounter EXAM: DG HIP  (WITH OR WITHOUT PELVIS) 3V RIGHT COMPARISON:  None. FINDINGS: Comminuted intratrochanteric fracture is noted with mild impaction and angulation. No soft tissue abnormality is seen. No other bony abnormality is noted. IMPRESSION: Right intratrochanteric femoral fracture Electronically Signed   By: Alcide Clever M.D.   On: 08/30/2017 20:39   Dg Femur, Min 2 Views Right  Result Date: 08/31/2017 CLINICAL DATA:  Intraoperative imaging for fixation of a right intertrochanteric fracture the patient suffered in a fall yesterday. Initial encounter. EXAM: DG C-ARM 61-120 MIN; RIGHT FEMUR 2 VIEWS COMPARISON:  Plain films right hip this same day. FINDINGS: Five fluoroscopic intraoperative spot views demonstrate a hip screw and intramedullary nail in place for fixation of a right intertrochanteric fracture. Position alignment are anatomic. No acute finding. IMPRESSION: ORIF right hip fracture.  No acute finding. Electronically Signed   By: Drusilla Kanner M.D.   On: 08/31/2017 13:36    Micro Results     No results found for this or any previous visit (from the past 240 hour(s)).     Today   Subjective:   [name] today has no headache,no chest or abdominal pain,no new weakness tingling or numbness, hip pain is controlled Objective:   Blood pressure (!) 129/53, pulse 65, temperature 97.9 F (36.6 C), temperature source Oral, resp. rate 14, height 5\' 2"  (1.575 m), weight 90.2 kg (198 lb 13.7 oz), SpO2 92 %.   Intake/Output Summary (Last 24 hours) at 09/02/2017 1508 Last data filed at 09/02/2017 0733 Gross per 24 hour  Intake 480 ml  Output -  Net 480 ml    Exam Awake Alert, Oriented X 3, No new F.N deficits, Normal affect Good air  entry bilaterally, clear to auscultation, symmetrical chest wall movement RRR,no rubs, murmur or gallops +ve B.Sounds, Abd Soft, No tenderness,  guarding or rigidity. No Cyanosis, Clubbing or edema, No new Rash or bruise  , Sensation intact distally, intact  pulses distally,    Data Review   CBC w Diff:  Lab Results  Component Value Date   WBC 7.9 09/02/2017   HGB 8.2 (L) 09/02/2017   HCT 25.9 (L) 09/02/2017   PLT 121 (L) 09/02/2017   LYMPHOPCT 23 08/30/2017   MONOPCT 11 08/30/2017   EOSPCT 3 08/30/2017   BASOPCT 0 08/30/2017    CMP:  Lab Results  Component Value Date   NA 136 09/02/2017   K 4.7 09/02/2017   CL 105 09/02/2017   CO2 24 09/02/2017   BUN 28 (H) 09/02/2017   CREATININE 1.67 (H) 09/02/2017   PROT 7.5 08/30/2017   ALBUMIN 4.0 08/30/2017   BILITOT 1.0 08/30/2017   ALKPHOS 70 08/30/2017   AST 25 08/30/2017   ALT 19 08/30/2017  .   Total Time in preparing paper work, data evaluation and todays exam - 35 minutes  Huey Bienenstock M.D on 09/02/2017 at 3:08 PM  [facility]   Office  [phone]

## 2017-09-02 NOTE — Social Work (Addendum)
SNF has received insurance Auth.  CSW met with patient at bedside and confirmed the dc to [facility] today. Pt indicated that she would call her daughter and CSW did not need to do so.  CSW will f/u for disposition.  Elissa Hefty, LCSW Clinical Social Worker [phone]

## 2017-09-02 NOTE — Social Work (Signed)
CSW just discussed SNF offers with patient and they accepted [facility]. CSW confirmed SNF bed with [facility] in admissions. SNF will initiate Insurance Auth.  Keene BreathPatricia Donevan Biller, LCSW Clinical Social Worker [phone]

## 2017-09-02 NOTE — NC FL2 (Signed)
[place] MEDICAID FL2 LEVEL OF CARE SCREENING TOOL     IDENTIFICATION  Patient Name: Tracy Sullivan Birthdate: 11-06-1950 Sex: female Admission Date (Current Location): 08/30/2017  [place] Number:   [facility] and Address:  [facility], [address], [place], [place] [address]      Provider Number: [id number]  Attending Physician Name and Address:  Starleen Arms, MD  Relative Name and Phone Number:  Milus Height, mother, [phone]    Current Level of Care: Hospital Recommended Level of Care: Skilled Nursing Facility Prior Approval Number:    Date Approved/Denied:   PASRR Number: [id number]  Discharge Plan: SNF    Current Diagnoses: Patient Active Problem List   Diagnosis Date Noted  . Closed right hip fracture, initial encounter (HCC) 08/30/2017  . Diabetes mellitus without complication (HCC) 08/30/2017  . Hypertension 08/30/2017  . CKD (chronic kidney disease), stage III (HCC) 08/30/2017  . Chronic CHF (HCC) 08/30/2017  . Hypothyroidism 08/30/2017    Orientation RESPIRATION BLADDER Height & Weight     Self, Time, Situation, Place  Normal Continent Weight: 198 lb 13.7 oz (90.2 kg) Height:  5\' 2"  (157.5 cm)  BEHAVIORAL SYMPTOMS/MOOD NEUROLOGICAL BOWEL NUTRITION STATUS      Continent Diet(See DC Summary)  AMBULATORY STATUS COMMUNICATION OF NEEDS Skin   Extensive Assist Verbally Surgical wounds                       Personal Care Assistance Level of Assistance  Dressing, Feeding, Bathing Bathing Assistance: Maximum assistance Feeding assistance: Independent Dressing Assistance: Maximum assistance     Functional Limitations Info  Sight, Hearing, Speech Sight Info: Adequate Hearing Info: Adequate Speech Info: Adequate    SPECIAL CARE FACTORS FREQUENCY  PT (By licensed PT), OT (By licensed OT)     PT Frequency: 2x week OT Frequency: 2x week            Contractures      Additional  Factors Info  Code Status, Allergies, Insulin Sliding Scale Code Status Info: DNR Allergies Info: CODEINE, TRAMADOL    Insulin Sliding Scale Info: Insulin Daily       Current Medications (09/02/2017):  This is the current hospital active medication list Current Facility-Administered Medications  Medication Dose Route Frequency Provider Last Rate Last Dose  . aspirin EC tablet 325 mg  325 mg Oral Q breakfast Kathryne Hitch, MD   325 mg at 09/02/17 0933  . atorvastatin (LIPITOR) tablet 80 mg  80 mg Oral q1800 Kathryne Hitch, MD   80 mg at 09/01/17 1649  . bisacodyl (DULCOLAX) EC tablet 5 mg  5 mg Oral Daily PRN Kathryne Hitch, MD      . carvedilol (COREG) tablet 25 mg  25 mg Oral BID WC Kathryne Hitch, MD   25 mg at 09/02/17 0934  . docusate sodium (COLACE) capsule 100 mg  100 mg Oral BID Kathryne Hitch, MD   100 mg at 09/02/17 0934  . gabapentin (NEURONTIN) capsule 300 mg  300 mg Oral TID Kathryne Hitch, MD   300 mg at 09/02/17 0934  . HYDROcodone-acetaminophen (NORCO) 7.5-325 MG per tablet 1-2 tablet  1-2 tablet Oral Q4H PRN Kathryne Hitch, MD   2 tablet at 09/02/17 [phone]  . HYDROcodone-acetaminophen (NORCO/VICODIN) 5-325 MG per tablet 1-2 tablet  1-2 tablet Oral Q4H PRN Kathryne Hitch, MD      . insulin aspart (novoLOG)  injection 0-9 Units  0-9 Units Subcutaneous Q4H Kathryne HitchBlackman, Christopher Y, MD   3 Units at 09/02/17 0935  . insulin glargine (LANTUS) injection 10 Units  10 Units Subcutaneous Daily Elgergawy, Leana Roeawood S, MD   10 Units at 09/02/17 0934  . levothyroxine (SYNTHROID, LEVOTHROID) tablet 88 mcg  88 mcg Oral QAC breakfast Kathryne HitchBlackman, Christopher Y, MD   88 mcg at 09/02/17 0934  . menthol-cetylpyridinium (CEPACOL) lozenge 3 mg  1 lozenge Oral PRN Kathryne HitchBlackman, Christopher Y, MD       Or  . phenol (CHLORASEPTIC) mouth spray 1 spray  1 spray Mouth/Throat PRN Kathryne HitchBlackman, Christopher Y, MD      . methocarbamol (ROBAXIN) tablet  500 mg  500 mg Oral Q6H PRN Kathryne HitchBlackman, Christopher Y, MD       Or  . methocarbamol (ROBAXIN) 500 mg in dextrose 5 % 50 mL IVPB  500 mg Intravenous Q6H PRN Kathryne HitchBlackman, Christopher Y, MD      . methocarbamol (ROBAXIN) tablet 500 mg  500 mg Oral Q6H PRN Kathryne HitchBlackman, Christopher Y, MD   500 mg at 09/01/17 2157   Or  . methocarbamol (ROBAXIN) 500 mg in dextrose 5 % 50 mL IVPB  500 mg Intravenous Q6H PRN Kathryne HitchBlackman, Christopher Y, MD      . metoCLOPramide (REGLAN) tablet 5-10 mg  5-10 mg Oral Q8H PRN Kathryne HitchBlackman, Christopher Y, MD       Or  . metoCLOPramide (REGLAN) injection 5-10 mg  5-10 mg Intravenous Q8H PRN Kathryne HitchBlackman, Christopher Y, MD      . morphine 2 MG/ML injection 0.5-1 mg  0.5-1 mg Intravenous Q2H PRN Kathryne HitchBlackman, Christopher Y, MD   0.5 mg at 08/31/17 0755  . morphine 2 MG/ML injection 0.5-1 mg  0.5-1 mg Intravenous Q2H PRN Kathryne HitchBlackman, Christopher Y, MD   0.5 mg at 08/31/17 2012  . ondansetron (ZOFRAN) injection 4 mg  4 mg Intravenous Q6H PRN Kathryne HitchBlackman, Christopher Y, MD      . ondansetron [facility](ZOFRAN) tablet 4 mg  4 mg Oral Q6H PRN Kathryne HitchBlackman, Christopher Y, MD       Or  . ondansetron [facility]ZOFRAN) injection 4 mg  4 mg Intravenous Q6H PRN Kathryne HitchBlackman, Christopher Y, MD      . pantoprazole (PROTONIX) EC tablet 40 mg  40 mg Oral Daily Kathryne HitchBlackman, Christopher Y, MD   40 mg at 09/02/17 0934  . polyethylene glycol (MIRALAX / GLYCOLAX) packet 17 g  17 g Oral Daily PRN Kathryne HitchBlackman, Christopher Y, MD      . senna-docusate (Senokot-S) tablet 1 tablet  1 tablet Oral QHS PRN Kathryne HitchBlackman, Christopher Y, MD      . traMADol Janean Sark(ULTRAM) tablet 50 mg  50 mg Oral Q6H Kathryne HitchBlackman, Christopher Y, MD   50 mg at 09/01/17 1649     Discharge Medications: Please see discharge summary for a list of discharge medications.  Relevant Imaging Results:  Relevant Lab Results:   Additional Information SS#:[id number]  Cornelius Schuitema V [place], LCSW

## 2017-09-02 NOTE — Social Work (Addendum)
Clinical Social Worker facilitated patient discharge including contacting patient family and facility to confirm patient discharge plans.  Clinical information faxed to facility and family agreeable with plan.    CSW arranged ambulance transport via [facility] at 4:00pm.    RN to call [phone] to give report prior to discharge.  Clinical Social Worker will sign off for now as social work intervention is no longer needed. Please consult us again if new need arises.  Keene BreathPatricia Arlyn Buerkle, LCSW Clinical Social Worker [phone]

## 2017-09-15 ENCOUNTER — Ambulatory Visit (INDEPENDENT_AMBULATORY_CARE_PROVIDER_SITE_OTHER): Payer: Medicare Other

## 2017-09-15 ENCOUNTER — Ambulatory Visit (INDEPENDENT_AMBULATORY_CARE_PROVIDER_SITE_OTHER): Payer: Medicare Other | Admitting: Orthopaedic Surgery

## 2017-09-15 ENCOUNTER — Encounter (INDEPENDENT_AMBULATORY_CARE_PROVIDER_SITE_OTHER): Payer: Self-pay | Admitting: Orthopaedic Surgery

## 2017-09-15 DIAGNOSIS — S72001A Fracture of unspecified part of neck of right femur, initial encounter for closed fracture: Secondary | ICD-10-CM

## 2017-09-15 MED ORDER — HYDROCODONE-ACETAMINOPHEN 10-325 MG PO TABS: 1.0000 | ORAL_TABLET | Freq: Four times a day (QID) | ORAL | 0 refills | Status: AC | PRN

## 2017-09-15 NOTE — Progress Notes (Signed)
The patient is here for first postoperative visit status post open reduction internal fixation of a right intertrochanteric hip fracture.  She relates with a walker at home and is doing well.  She lives in [place] and was in this area visiting her daughter when she fell.  On exam her staple lines look good to staples been removed and Steri-Strips applied.  She has significant bruising around her hip but overall it could put her through flexion extension as well as rotation.  X-rays of her pelvis and right hip show an anatomic alignment of intertrochanteric fracture with no evidence of hardware failure.  At this point should continue to increase her activities as comfort allows.  I did refill her pain medication.  I will see her back in 4 weeks to see how she is doing overall and the need 2 views of just the right hip.  I do not need to see the pelvis or the knee.

## 2017-10-16 ENCOUNTER — Ambulatory Visit (INDEPENDENT_AMBULATORY_CARE_PROVIDER_SITE_OTHER): Payer: Medicare Other | Admitting: Orthopaedic Surgery

## 2017-10-22 ENCOUNTER — Ambulatory Visit (INDEPENDENT_AMBULATORY_CARE_PROVIDER_SITE_OTHER): Payer: Medicare Other

## 2017-10-22 ENCOUNTER — Ambulatory Visit (INDEPENDENT_AMBULATORY_CARE_PROVIDER_SITE_OTHER): Payer: Medicare Other | Admitting: Physician Assistant

## 2017-10-22 ENCOUNTER — Encounter (INDEPENDENT_AMBULATORY_CARE_PROVIDER_SITE_OTHER): Payer: Self-pay | Admitting: Physician Assistant

## 2017-10-22 DIAGNOSIS — S72001D Fracture of unspecified part of neck of right femur, subsequent encounter for closed fracture with routine healing: Secondary | ICD-10-CM | POA: Diagnosis not present

## 2017-10-22 NOTE — Progress Notes (Signed)
HPI: Ms. Tracy Sullivan returns today just over 7 weeks status post IM nailing of an intertrochanteric right femur fracture.  She is overall doing well.  She states she has no pain in the hip.  She is been ambulating with a walker.  Unfortunately she was hospitalized due to gallstones and a UTI last week.  Physical exam: General well-developed well-nourished female in no acute distress. Right lower extremity: Surgical incisions are well-healed no signs of infection.  She has good range of motion of the right hip without pain.  Right calf supple nontender.  She emanates with a rolling walker with a nonantalgic gait.  Radiographs: Right hip AP and lateral views show good consolidation of the intertrochanteric fracture.  There is no hardware failure.  The femoral heads well located.  No acute fracture  Impression: Status post IM nailing right hip intertrochanteric fracture 08/31/2017  Plan: She will continue work on range of motion strengthening right hip.  Follow-up with us on as-needed basis if she has any questions or concerns please to return.  She is activities as tolerated.

## 2018-04-09 ENCOUNTER — Encounter (HOSPITAL_COMMUNITY): Payer: Self-pay | Admitting: Emergency Medicine

## 2018-04-09 ENCOUNTER — Other Ambulatory Visit: Payer: Self-pay

## 2018-04-09 ENCOUNTER — Emergency Department (HOSPITAL_COMMUNITY)
Admission: EM | Admit: 2018-04-09 | Discharge: 2018-04-09 | Disposition: A | Discharge status: Home / Self Care | Payer: Medicare Other | Attending: Emergency Medicine | Admitting: Emergency Medicine

## 2018-04-09 DIAGNOSIS — Z7982 Long term (current) use of aspirin: Secondary | ICD-10-CM | POA: Insufficient documentation

## 2018-04-09 DIAGNOSIS — Z794 Long term (current) use of insulin: Secondary | ICD-10-CM | POA: Insufficient documentation

## 2018-04-09 DIAGNOSIS — E039 Hypothyroidism, unspecified: Secondary | ICD-10-CM | POA: Diagnosis not present

## 2018-04-09 DIAGNOSIS — I509 Heart failure, unspecified: Secondary | ICD-10-CM | POA: Insufficient documentation

## 2018-04-09 DIAGNOSIS — Z79899 Other long term (current) drug therapy: Secondary | ICD-10-CM | POA: Diagnosis not present

## 2018-04-09 DIAGNOSIS — R42 Dizziness and giddiness: Secondary | ICD-10-CM | POA: Diagnosis present

## 2018-04-09 DIAGNOSIS — N183 Chronic kidney disease, stage 3 (moderate): Secondary | ICD-10-CM | POA: Insufficient documentation

## 2018-04-09 DIAGNOSIS — N3000 Acute cystitis without hematuria: Secondary | ICD-10-CM | POA: Insufficient documentation

## 2018-04-09 DIAGNOSIS — I130 Hypertensive heart and chronic kidney disease with heart failure and stage 1 through stage 4 chronic kidney disease, or unspecified chronic kidney disease: Secondary | ICD-10-CM | POA: Insufficient documentation

## 2018-04-09 DIAGNOSIS — E1122 Type 2 diabetes mellitus with diabetic chronic kidney disease: Secondary | ICD-10-CM | POA: Insufficient documentation

## 2018-04-09 HISTORY — DX: Other specified disorders of kidney and ureter: N28.89

## 2018-04-09 LAB — URINALYSIS, ROUTINE W REFLEX MICROSCOPIC
Bilirubin Urine: NEGATIVE
GLUCOSE, UA: NEGATIVE mg/dL
Hgb urine dipstick: NEGATIVE
Ketones, ur: NEGATIVE mg/dL
Nitrite: NEGATIVE
PROTEIN: 30 mg/dL — AB
Specific Gravity, Urine: 1.018 (ref 1.005–1.030)
WBC, UA: >50 WBC/hpf — ABNORMAL HIGH (ref 0–5)
pH: 5 (ref 5.0–8.0)

## 2018-04-09 LAB — CBG MONITORING, ED: GLUCOSE-CAPILLARY: 150 mg/dL — AB (ref 70–99)

## 2018-04-09 MED ORDER — CEPHALEXIN 500 MG PO CAPS: 500.0000 mg | ORAL_CAPSULE | Freq: Three times a day (TID) | ORAL | 0 refills | Status: AC

## 2018-04-09 MED ORDER — CEPHALEXIN 500 MG PO CAPS
500.0000 mg | ORAL_CAPSULE | Freq: Once | ORAL | Status: AC
  Administered 2018-04-09: 500 mg via ORAL
  Filled 2018-04-09: qty 1

## 2018-04-09 NOTE — Discharge Instructions (Addendum)
Drink plenty of fluids.  Use your Zofran as needed for nausea or vomiting.  Recheck if you get fever, have uncontrolled vomiting, worsening pain, or if you are not improving over the next 48 hours.  You can check in my chart for the results of your urine culture which should be done in about 48 hours.  Those results will help your doctor pick a new antibiotic if the one I chose is not working for you.

## 2018-04-09 NOTE — ED Triage Notes (Signed)
Pt c/o n/v, chills, left lower flank pain x 2 days, pt states she has hx of UTIs and these are the symptoms she usually has with them, denies fever and urinary symptoms

## 2018-04-09 NOTE — ED Provider Notes (Signed)
a [facility] EMERGENCY DEPARTMENT Provider Note   CSN: [id number] Arrival date & time: 04/09/18  0501  Time seen 05:35 AM   History   Chief Complaint Chief Complaint  Patient presents with  . Urinary Tract Infection    HPI Tracy Sullivan is a 67 y.o. female.  HPI patient states that she had fallen in June and fractured her hip.  Her daughter states she went to the rehab facility ambulatory and within a week they had her bedridden and wearing diapers.  She then developed a urinary tract infection which ultimately had her admitted to the hospital for IV antibiotics.  Daughter states the antibiotic the use wasat that time was Invanz after Meropenem wasn't working.  Her daughter states she thinks it was E. coli.  They found a "shadow" on her left kidney at the time of her initial diagnosis of UTI and on a repeat CT scan in September it was 2 cm in size.  She supposed to have a another CT scan in December.  She has not had a biopsy.  Patient states she started feeling bad 2 days ago and states she is dizzy and lightheaded when she stands up.  She had nausea and vomiting x4 yesterday and tonight has had a total of 8 mg of Zofran and she is no longer nauseated.  She denies fever but has had chills.  She denies dysuria or frequency.  She has good urinary output right after she takes her Lasix but afterwards she has a small amount of urine.  She states her intake has been less because she has no desire to drink.  Patient states when she was admitted she was in [facility] in [place].  PCP Irene Shipper, MD  Past Medical History:  Diagnosis Date  . Complication of anesthesia   . Diabetes mellitus without complication (HCC)   . Enlarged heart   . Hypertension   . Mass of kidney 2019  . Osteoporosis   . PONV (postoperative nausea and vomiting)   . Thyroid disease     Patient Active Problem List   Diagnosis Date Noted  . Closed right hip fracture, initial encounter (HCC)  08/30/2017  . Diabetes mellitus without complication (HCC) 08/30/2017  . Hypertension 08/30/2017  . CKD (chronic kidney disease), stage III (HCC) 08/30/2017  . Chronic CHF (HCC) 08/30/2017  . Hypothyroidism 08/30/2017    Past Surgical History:  Procedure Laterality Date  . BACK SURGERY    . CARDIAC DEFIBRILLATOR PLACEMENT     defibrillator and pacemaker per pt  . CESAREAN SECTION    . INTRAMEDULLARY (IM) NAIL INTERTROCHANTERIC Right 08/31/2017   Procedure: INTRAMEDULLARY (IM) NAIL INTERTROCHANTRIC;  Surgeon: Kathryne Hitch, MD;  Location: [facility] OR;  Service: Orthopedics;  Laterality: Right;  . TOTAL ELBOW REPLACEMENT       OB History   None      Home Medications    Prior to Admission medications   Medication Sig Start Date End Date Taking? Authorizing Provider  acetaminophen (TYLENOL) 650 MG CR tablet Take 650 mg by mouth every 8 (eight) hours as needed for pain.    [provider]  aspirin EC 325 MG EC tablet Take 1 tablet (325 mg total) by mouth daily with breakfast. Please take for 30 days, then change to 81 mg daily after that. 09/03/17   Elgergawy, Leana Roe, MD  atorvastatin (LIPITOR) 80 MG tablet Take 1 tablet (80 mg total) by mouth daily at 6 PM. 09/02/17  Elgergawy, Leana Roe, MD  bisacodyl (DULCOLAX) 5 MG EC tablet Take 1 tablet (5 mg total) by mouth daily as needed for moderate constipation. 09/02/17   Elgergawy, Leana Roe, MD  carvedilol (COREG) 25 MG tablet  07/08/17   [provider]  cephALEXin (KEFLEX) 500 MG capsule Take 1 capsule (500 mg total) by mouth 3 (three) times daily. 04/09/18   Devoria Albe, MD  ferrous sulfate 325 (65 FE) MG EC tablet Take 1 tablet (325 mg total) by mouth 2 (two) times daily. 09/02/17 09/02/18  Elgergawy, Leana Roe, MD  furosemide (LASIX) 40 MG tablet Take 40 mg by mouth daily. 07/21/16   [provider]  gabapentin (NEURONTIN) 300 MG capsule Take 300 mg by mouth 3 (three) times daily.  07/22/16   [provider]  HYDROcodone-acetaminophen (NORCO) 10-325 MG tablet Take 1 tablet by mouth every 6 (six) hours as needed. 09/15/17   Kathryne Hitch, MD  insulin NPH-regular Human (HUMULIN 70/30) (70-30) 100 UNIT/ML injection Inject 20 Units into the skin daily.    [provider]  levothyroxine (SYNTHROID, LEVOTHROID) 88 MCG tablet Take 88 mcg by mouth daily.  07/08/17   [provider]  lisinopril (PRINIVIL,ZESTRIL) 20 MG tablet Take 1 tablet (20 mg total) by mouth daily. 09/02/17   Elgergawy, Leana Roe, MD  loratadine (CLARITIN) 10 MG tablet Take 1 tablet (10 mg total) by mouth daily. 09/02/17   Elgergawy, Leana Roe, MD  methocarbamol (ROBAXIN) 500 MG tablet Take 1 tablet (500 mg total) by mouth every 6 (six) hours as needed for muscle spasms. 09/02/17   Elgergawy, Leana Roe, MD  omeprazole (PRILOSEC) 20 MG capsule Take 20 mg by mouth daily. 08/06/16   [provider]  polyethylene glycol (MIRALAX / GLYCOLAX) packet Take 17 g by mouth daily as needed for mild constipation. 09/02/17   Elgergawy, Leana Roe, MD  Semaglutide (OZEMPIC) 0.25 or 0.5 MG/DOSE SOPN Inject 0.5 Units into the skin once a week. Takes on tuesdays    [provider]  senna-docusate (SENOKOT-S) 8.6-50 MG tablet Take 1 tablet by mouth 2 (two) times daily. 09/02/17   Elgergawy, Leana Roe, MD  tiZANidine (ZANAFLEX) 4 MG tablet Take 4 mg by mouth as needed. 08/06/16   [provider]    Family History Family History  Problem Relation Age of Onset  . Obesity Daughter     Social History Social History   Tobacco Use  . Smoking status: Never Smoker  . Smokeless tobacco: Never Used  Substance Use Topics  . Alcohol use: Never    Frequency: Never  . Drug use: Never  lives in [place]   Allergies   Codeine and Tramadol   Review of Systems Review of Systems  All other systems reviewed and are negative.    Physical Exam Updated Vital Signs BP (!) 142/65 (BP Location: Left  Arm)   Pulse 94   Temp 98.1 F (36.7 C) (Oral)   Resp 14   Ht 5\' 2"  (1.575 m)   Wt 94.8 kg   SpO2 100%   BMI 38.23 kg/m   Physical Exam  Constitutional: She is oriented to person, place, and time. She appears well-developed and well-nourished.  Non-toxic appearance. She does not appear ill. No distress.  HENT:  Head: Normocephalic and atraumatic.  Right Ear: External ear normal.  Left Ear: External ear normal.  Nose: Nose normal. No mucosal edema or rhinorrhea.  Mouth/Throat: Oropharynx is clear and moist and mucous membranes are normal. No  dental abscesses or uvula swelling.  Eyes: Pupils are equal, round, and reactive to light. Conjunctivae and EOM are normal.  Neck: Normal range of motion and full passive range of motion without pain. Neck supple.  Cardiovascular: Normal rate, regular rhythm and normal heart sounds. Exam reveals no gallop and no friction rub.  No murmur heard. Pulmonary/Chest: Effort normal and breath sounds normal. No respiratory distress. She has no wheezes. She has no rhonchi. She has no rales. She exhibits no tenderness and no crepitus.  Abdominal: Soft. Normal appearance and bowel sounds are normal. She exhibits no distension. There is no tenderness. There is no rebound and no guarding.  Patient denies CVA tenderness to percussion bilaterally  Musculoskeletal: Normal range of motion. She exhibits no edema or tenderness.  Moves all extremities well.   Neurological: She is alert and oriented to person, place, and time. She has normal strength. No cranial nerve deficit.  Skin: Skin is warm, dry and intact. No rash noted. No erythema. No pallor.  Psychiatric: She has a normal mood and affect. Her speech is normal and behavior is normal. Her mood appears not anxious.  Nursing note and vitals reviewed.    ED Treatments / Results  Labs (all labs ordered are listed, but only abnormal results are displayed) Results for orders placed or performed during the  hospital encounter of 04/09/18  Urinalysis, Routine w reflex microscopic  Result Value Ref Range   Color, Urine AMBER (A) YELLOW   APPearance CLOUDY (A) CLEAR   Specific Gravity, Urine 1.018 1.005 - 1.030   pH 5.0 5.0 - 8.0   Glucose, UA NEGATIVE NEGATIVE mg/dL   Hgb urine dipstick NEGATIVE NEGATIVE   Bilirubin Urine NEGATIVE NEGATIVE   Ketones, ur NEGATIVE NEGATIVE mg/dL   Protein, ur 30 (A) NEGATIVE mg/dL   Nitrite NEGATIVE NEGATIVE   Leukocytes, UA MODERATE (A) NEGATIVE   RBC / HPF 6-10 0 - 5 RBC/hpf   WBC, UA >50 (H) 0 - 5 WBC/hpf   Bacteria, UA FEW (A) NONE SEEN   Squamous Epithelial / LPF 11-20 0 - 5   WBC Clumps PRESENT    Mucus PRESENT    Hyaline Casts, UA PRESENT    Uric Acid Crys, UA PRESENT   CBG monitoring, ED  Result Value Ref Range   Glucose-Capillary 150 (H) 70 - 99 mg/dL   Laboratory interpretation all normal except probable UTI with white blood cell clumps present, urine culture sent    EKG None  Radiology No results found.  Procedures Procedures (including critical care time)  Medications Ordered in ED Medications  cephALEXin (KEFLEX) capsule 500 mg (500 mg Oral Given 04/09/18 0272)     Initial Impression / Assessment and Plan / ED Course  I have reviewed the triage vital signs and the nursing notes.  Pertinent labs & imaging results that were available during my care of the patient were reviewed by me and considered in my medical decision making (see chart for details).     I tried to look up [facility] in [place] and care everywhere and it is not listed.  After reviewing her urinalysis she was started on Keflex.  I had a talk with the patient and her daughter that a urine culture was sent and within 48 hours they should see some results.  Patient states she is going back home tomorrow.  She can go into my chart to get these test results which will help her doctor if he needs to change  her antibiotics.  Patient states she has  plenty of Zofran to take at home.  I did not do a CT tonight because we have no old scans to compare to.  She does not have fever to suggest pyelonephritis.  She has not had hematuria to suggest thus mass is bleeding.  And she is not having significant severe pain to suggest something else is going on acutely.  Patient has mild hyperglycemia that does not need to be addressed in the ED this morning.  Final Clinical Impressions(s) / ED Diagnoses   Final diagnoses:  Acute cystitis without hematuria    ED Discharge Orders         Ordered    cephALEXin (KEFLEX) 500 MG capsule  3 times daily     04/09/18 0636         Plan discharge  Devoria AlbeIva Anuja Manka, MD, Concha PyoFACEP    Braedin Millhouse, MD 04/09/18 [phone]

## 2018-04-12 LAB — URINE CULTURE: SPECIAL REQUESTS: NORMAL

## 2018-04-13 ENCOUNTER — Telehealth: Payer: Self-pay | Admitting: *Deleted

## 2018-04-13 NOTE — Telephone Encounter (Signed)
Post ED Visit - Positive Culture Follow-up: Unsuccessful Patient Follow-up  Culture assessed and recommendations reviewed by:  [x]  Enzo BiNathan Batchelder, Pharm.D. []  Celedonio MiyamotoJeremy Frens, Pharm.D., BCPS AQ-ID []  Garvin FilaMike Maccia, Pharm.D., BCPS []  Georgina PillionElizabeth Martin, [address]Ph[address]D., BCPS []  ChristineMinh Pham, [address]Ph[address]D., BCPS, AAHIVP []  Estella HuskMichelle Turner, Pharm.D., BCPS, AAHIVP []  Sherlynn CarbonAustin Lucas, PharmD []  Pollyann SamplesAndy Johnston, PharmD, BCPS  Positive urine culture  []  Patient discharged without antimicrobial prescription and treatment is now indicated [x]  Organism is resistant to prescribed ED discharge antimicrobial []  Patient with positive blood cultures  Plan: Stop Keflex, Give Fosfomycin 3g PO x 1 dose, if worsens recommend coming back to ED, recheck renal scan sooner than later, Eyvonne MechanicJeffrey Hedges, PA-C  Unable to contact patient after 3 attempts, letter will be sent to address on file  Lysle PearlRobertson, Valdez Brannan Talley 04/13/2018, 9:25 AM

## 2018-07-20 IMAGING — DX DG KNEE COMPLETE 4+V*R*
3 series · 3 of 3 positions shown · non-contrast
Comparison: None.

CLINICAL DATA: Recent fall with proximal right femoral fracture

EXAM:
RIGHT KNEE - COMPLETE 3 VIEW

[knee ap (1 of 2)]
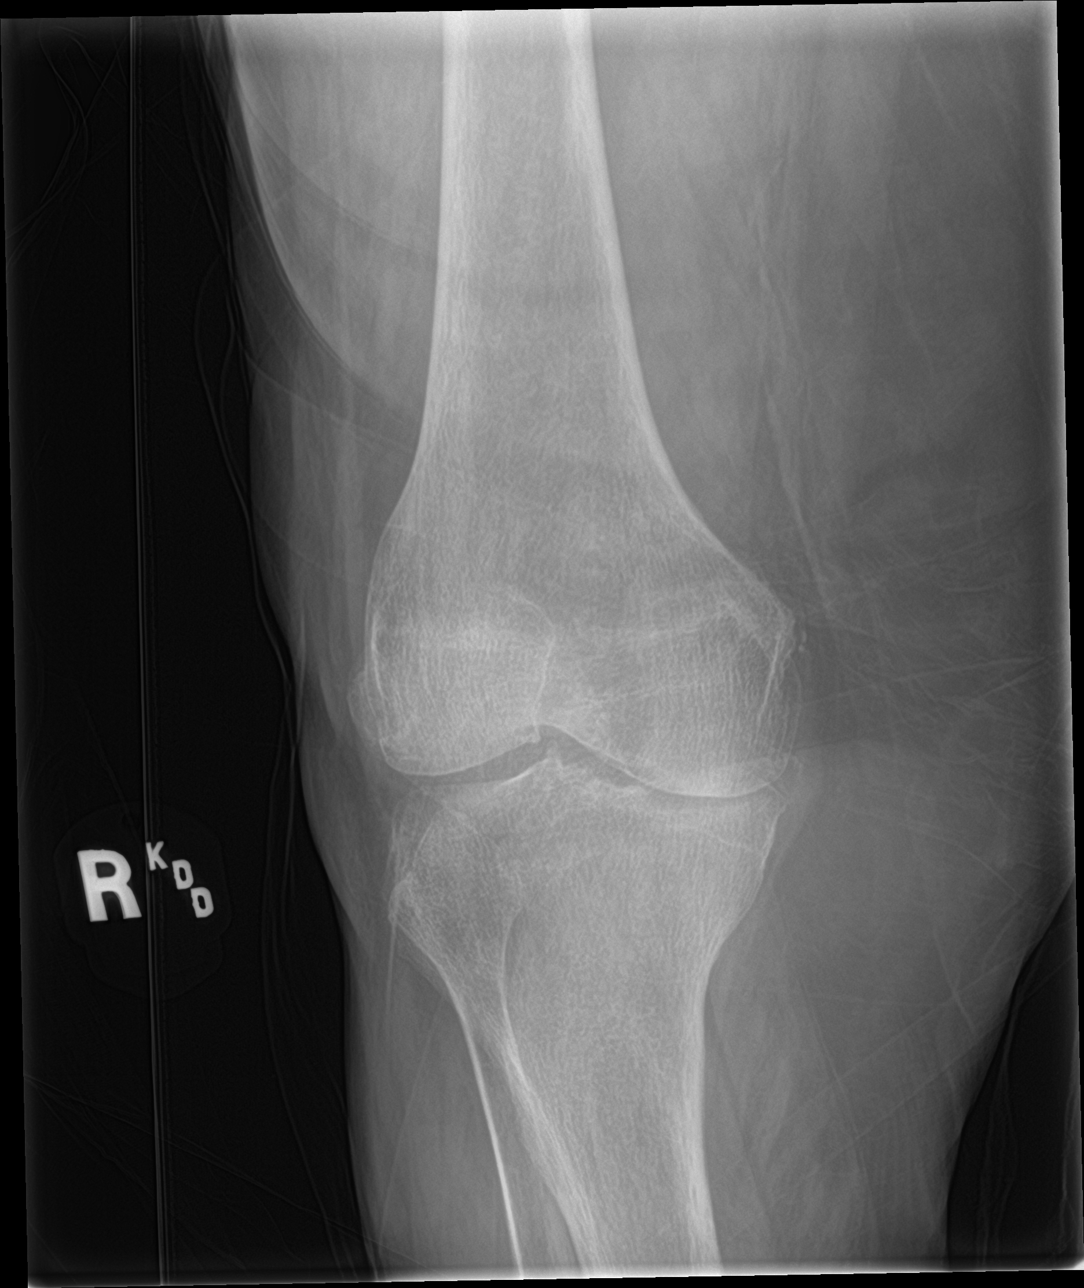

[knee lat]
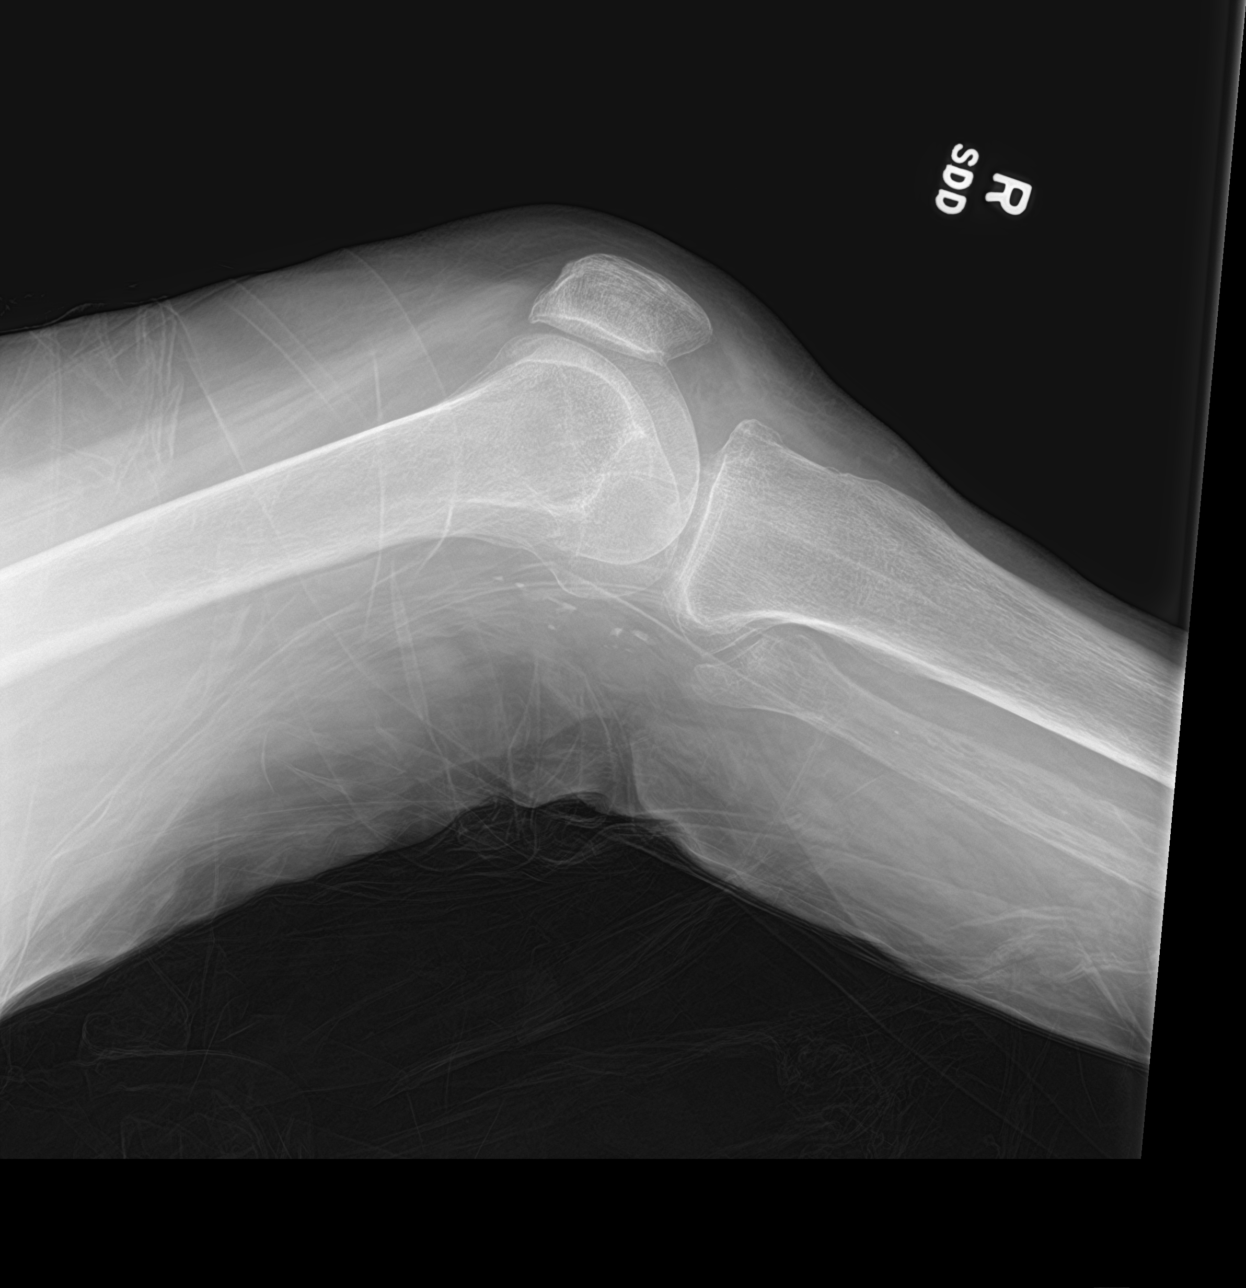

[knee ap (2 of 2)]
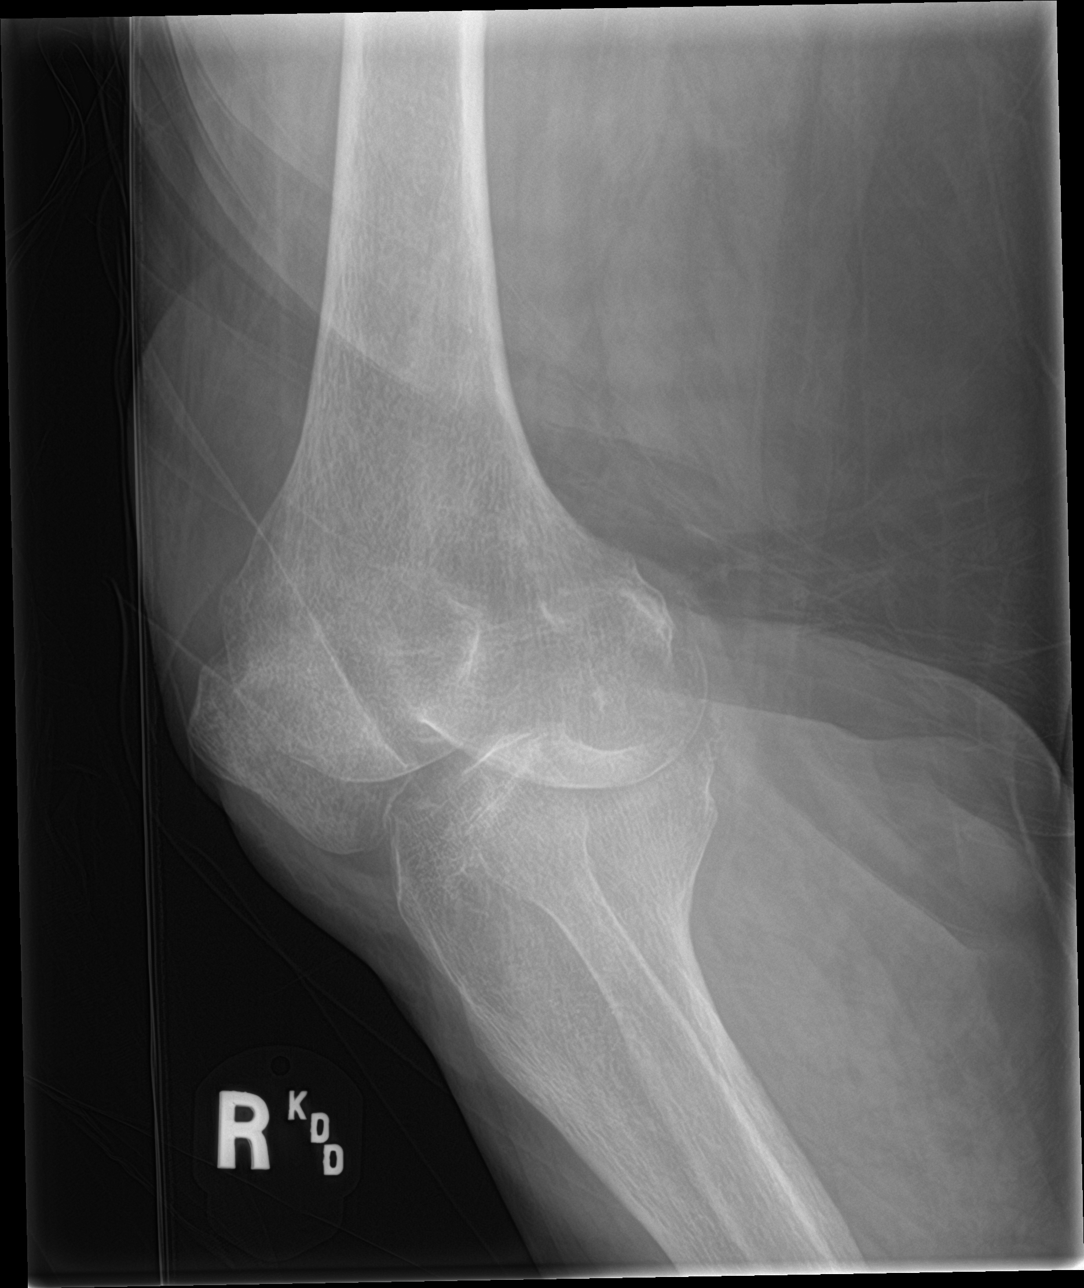

[3 of 3 positions shown; findings below may reference images not displayed]

FINDINGS: No acute fracture or dislocation is noted. No joint effusion is
seen. No soft tissue abnormality is noted.
IMPRESSION: No acute abnormality noted.

## 2019-04-13 DEATH — deceased
